# Patient Record
Sex: Female | Born: 1955 | Race: White | Hispanic: No | Marital: Married | Smoking: Never smoker
Health system: Southern US, Community
[De-identification: ages and names within clinical notes are randomized; demographics above are authoritative.]

## PROBLEM LIST (undated history)

## (undated) DIAGNOSIS — E039 Hypothyroidism, unspecified: Secondary | ICD-10-CM

## (undated) DIAGNOSIS — M25559 Pain in unspecified hip: Secondary | ICD-10-CM

## (undated) DIAGNOSIS — H269 Unspecified cataract: Secondary | ICD-10-CM

## (undated) DIAGNOSIS — M1990 Unspecified osteoarthritis, unspecified site: Secondary | ICD-10-CM

## (undated) DIAGNOSIS — I10 Essential (primary) hypertension: Secondary | ICD-10-CM

## (undated) HISTORY — PX: ABDOMINAL HYSTERECTOMY: SHX81

## (undated) HISTORY — PX: CHOLECYSTECTOMY: SHX55

## (undated) HISTORY — PX: BACK SURGERY: SHX140

---

## 2004-08-03 ENCOUNTER — Encounter: Admission: RE | Admit: 2004-08-03 | Discharge: 2004-08-03 | Payer: Self-pay | Admitting: Internal Medicine

## 2004-08-27 ENCOUNTER — Encounter: Admission: RE | Admit: 2004-08-27 | Discharge: 2004-08-27 | Payer: Self-pay | Admitting: Specialist

## 2004-09-24 ENCOUNTER — Encounter: Admission: RE | Admit: 2004-09-24 | Discharge: 2004-09-24 | Payer: Self-pay | Admitting: Specialist

## 2004-11-14 ENCOUNTER — Encounter: Admission: RE | Admit: 2004-11-14 | Discharge: 2004-11-14 | Payer: Self-pay | Admitting: Neurology

## 2004-11-24 ENCOUNTER — Encounter: Admission: RE | Admit: 2004-11-24 | Discharge: 2004-11-24 | Payer: Self-pay | Admitting: Neurology

## 2004-12-27 ENCOUNTER — Ambulatory Visit (HOSPITAL_COMMUNITY): Admission: RE | Admit: 2004-12-27 | Discharge: 2004-12-28 | Payer: Self-pay | Admitting: Specialist

## 2005-11-11 IMAGING — CT CT L SPINE W/ CM
3 of 11 series · 13 of 36 positions shown, 15 images · IV contrast (omnipaque)
Comparison: none

CLINICAL DATA: Back and right leg pain. 
 LUMBAR MYELOGRAM:
 Following informed consent, sterile preparation of the back, and adequate local anesthesia, a lumbar puncture was performed using a 22 gauge spinal needle at L2-3 from a left paramedian approach.  Fluid was clear and colorless.  15 cc of Omnipaque 180 was instilled in the subarachnoid space.  AP, lateral, and oblique views demonstrate a mild bulge at L4-5.  There is no nerve root cut off or spinal stenosis.  Flexion/extension show no abnormal movement.  Side-to-side bending shows no increase in lateral recess stenosis.  
 POST MYELOGRAM CT:
 L1-2:  Normal interspace. 
 L2-3:  Normal interspace. 
 L3-4:  Normal interspace. 
 L4-5:  Mild bulge.  Mild facet disease.  No stenosis or disk protrusion.
 L5-S1:  Mild bulge.  No stenosis or disk protrusion.
 Far right and left parasagittal images show no significant foraminal narrowing.

[Series 4: recon 3: l-spine helical · axial · 0.27mm/px · z∈[-267,-113]mm · 5 of 377 slices shown, 7 images]
[im 63/377  soft-tissue]
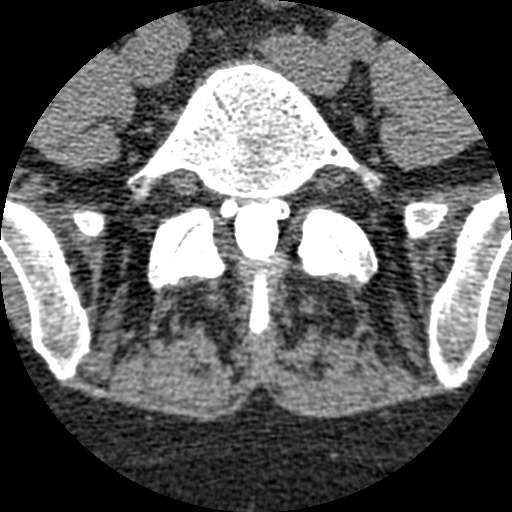
[im 63/377  bone]
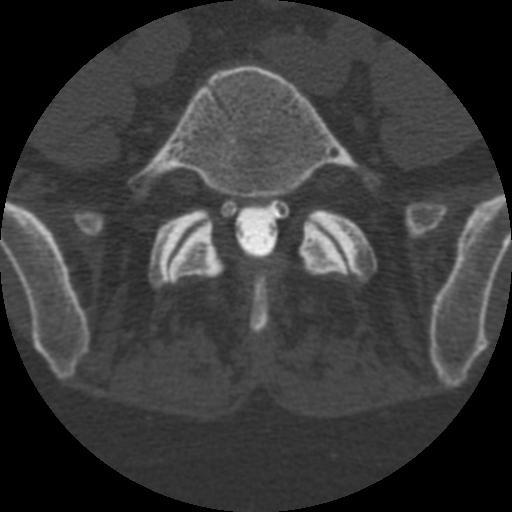
[im 126/377  bone]
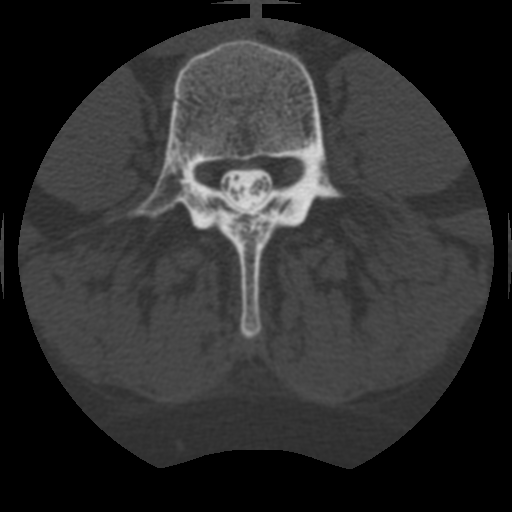
[im 189/377  bone]
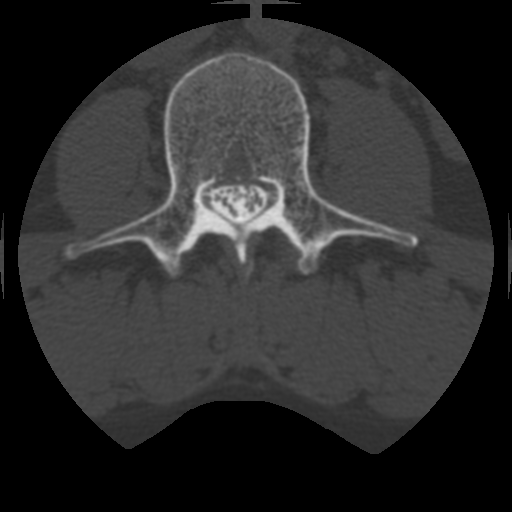
[im 251/377  bone]
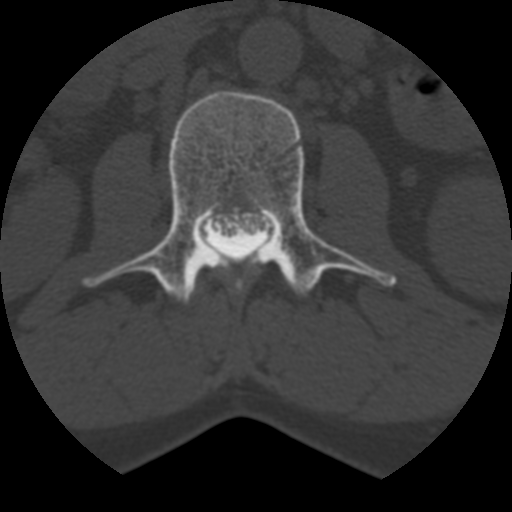
[im 314/377  soft-tissue]
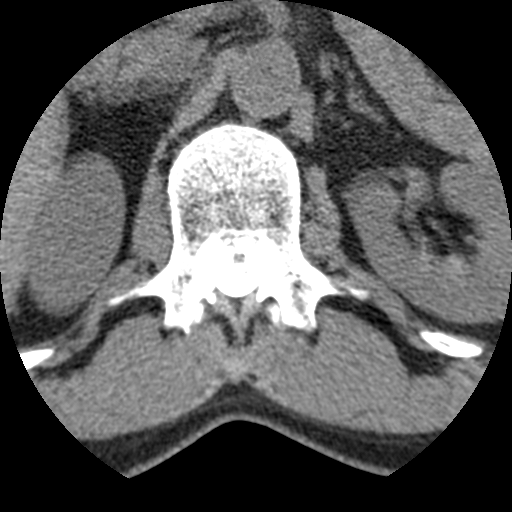
[im 314/377  bone]
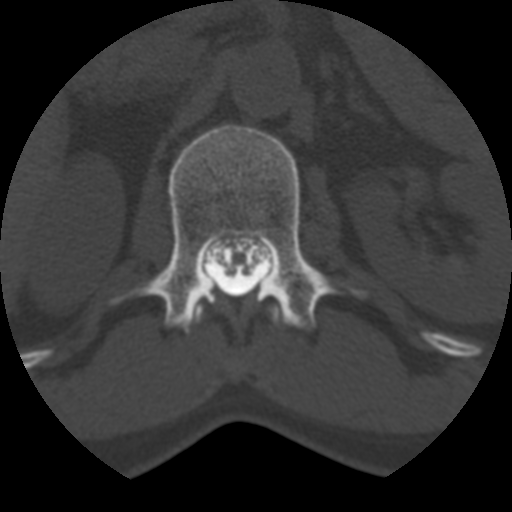

[Series 400: reformatted · sagittal · 0.46mm/px · 2 of 40 slices shown (1 of 2)]
[im 14/40  bone]
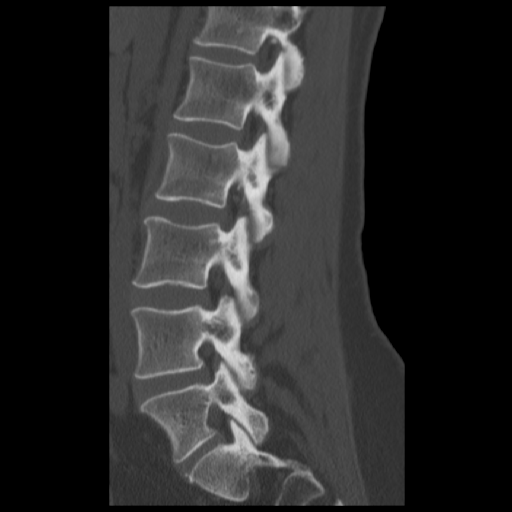
[im 27/40  bone]
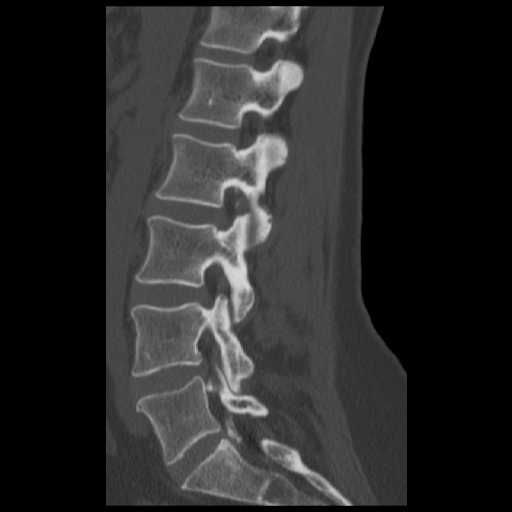

[Series 401: reformatted · coronal · 0.46mm/px · 6 of 20 slices shown (2 of 2)]
[im 7/20  bone]
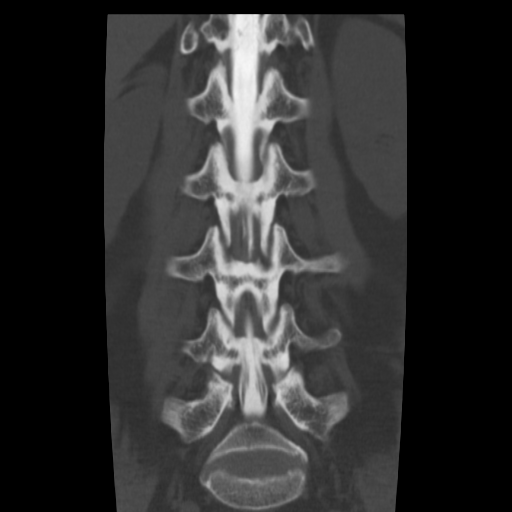
[im 8/20  bone]
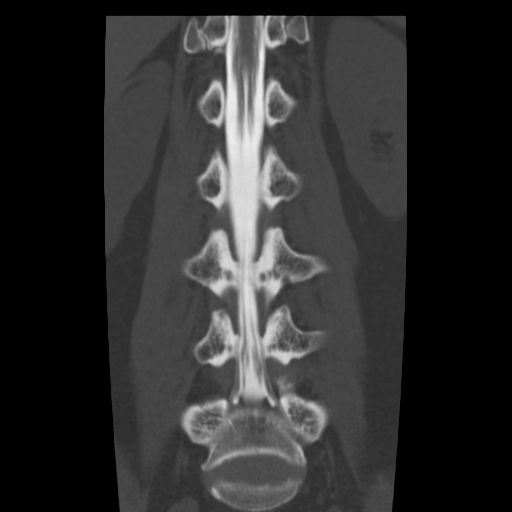
[im 10/20  bone]
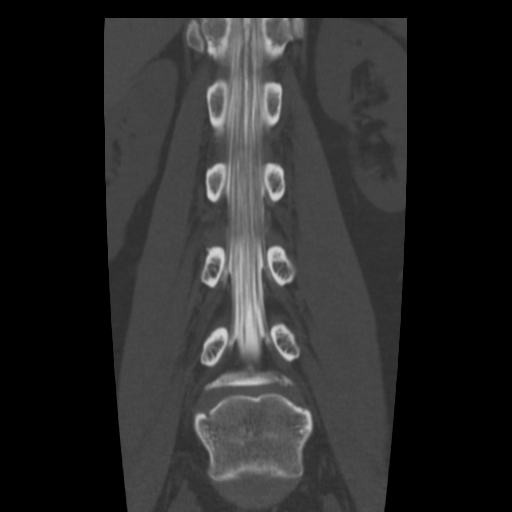
[im 11/20  soft-tissue]
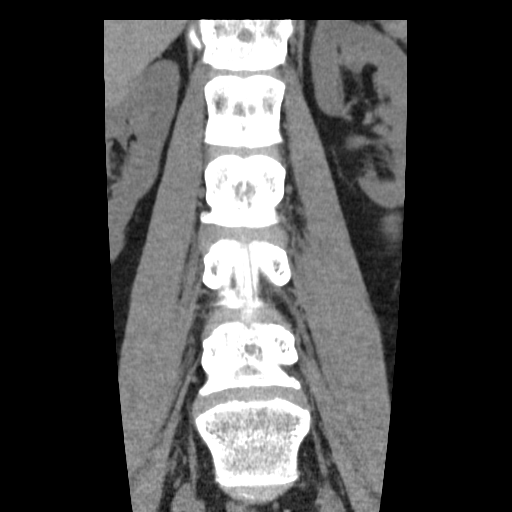
[im 12/20  bone]
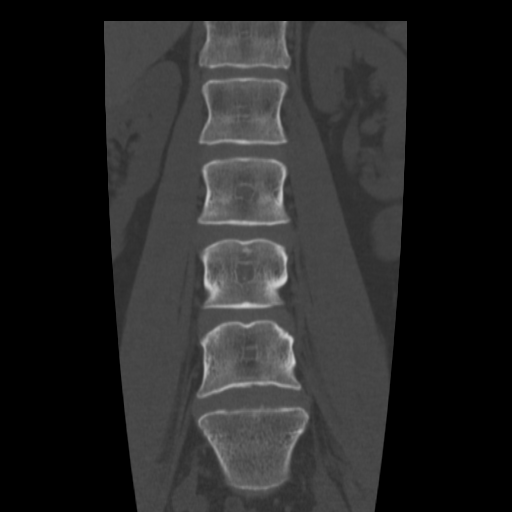
[im 13/20  bone]
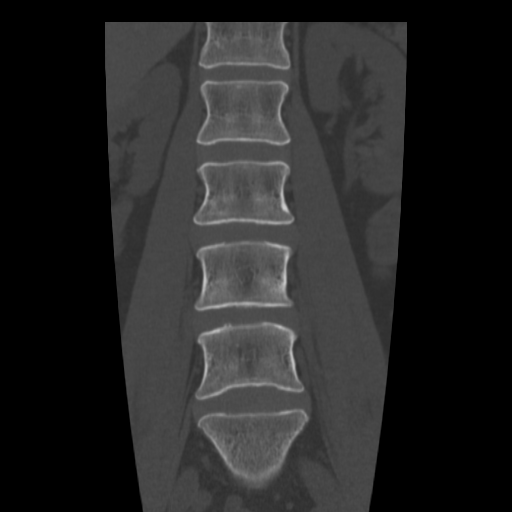

[13 of 36 positions shown; findings below may reference images not displayed]

IMPRESSION: 1.  Mild annular bulging L4-5 and L5-S1.
 2.  No significant disk protrusion or spinal stenosis.
 3.  Mild facet arthropathy L4-5.

## 2006-02-13 IMAGING — CR DG SPINE 1V PORT
1 series · 1 of 1 positions shown · non-contrast
Comparison: none.

CLINICAL DATA: L4-5 lumbar stenosis.
 LUMBAR SPINE ? 1   VIEW:

[view not recorded]
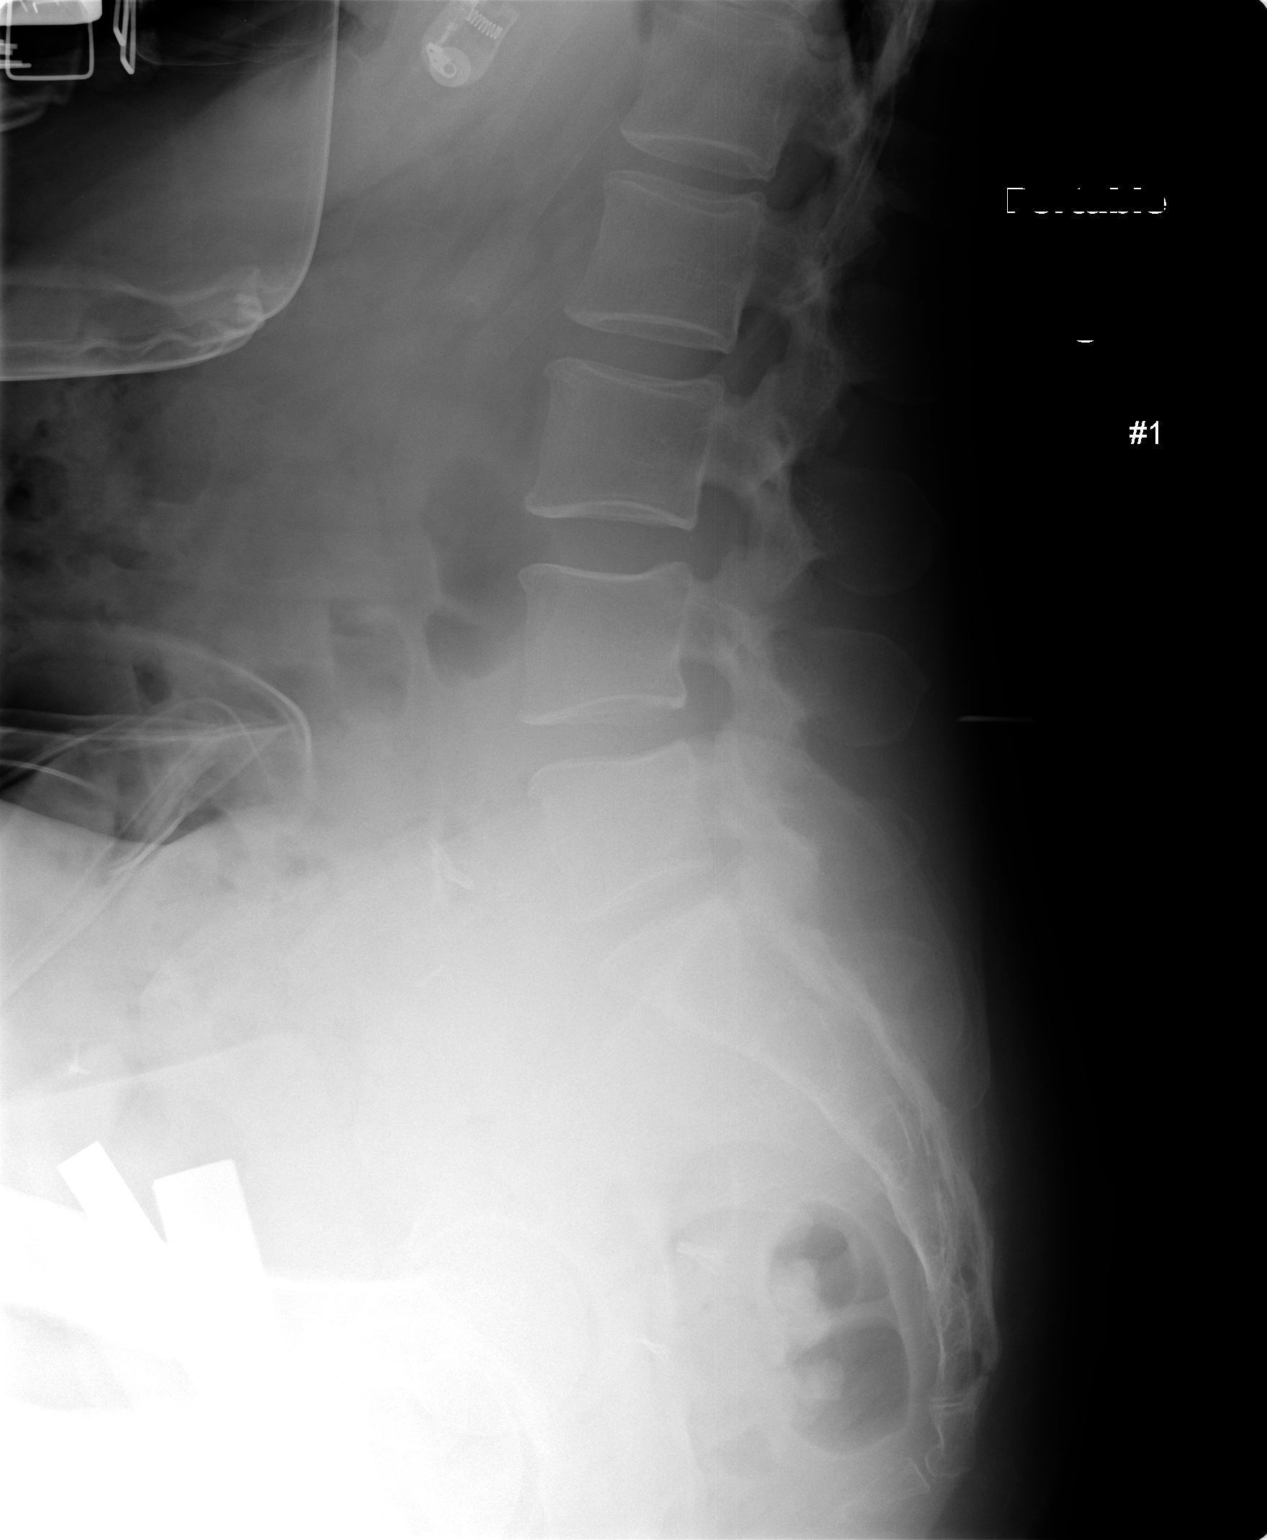

[1 of 1 positions shown; findings below may reference images not displayed]

FINDINGS: Alignment of the lumbar spine is normal.  Vertebral body heights and disk spaces are well preserved.  Surgical probe is identified posterior to the L4-5 disk space.
IMPRESSION: Probe posterior to the L4-5 disk space.

## 2006-02-13 IMAGING — CR DG LUMBAR SPINE 1V
1 series · 1 of 1 positions shown · non-contrast
Comparison: 12/27/04.

CLINICAL DATA: HNP at L4-5. 
 LUMBAR SPINE - 1 VIEW:

[view not recorded]
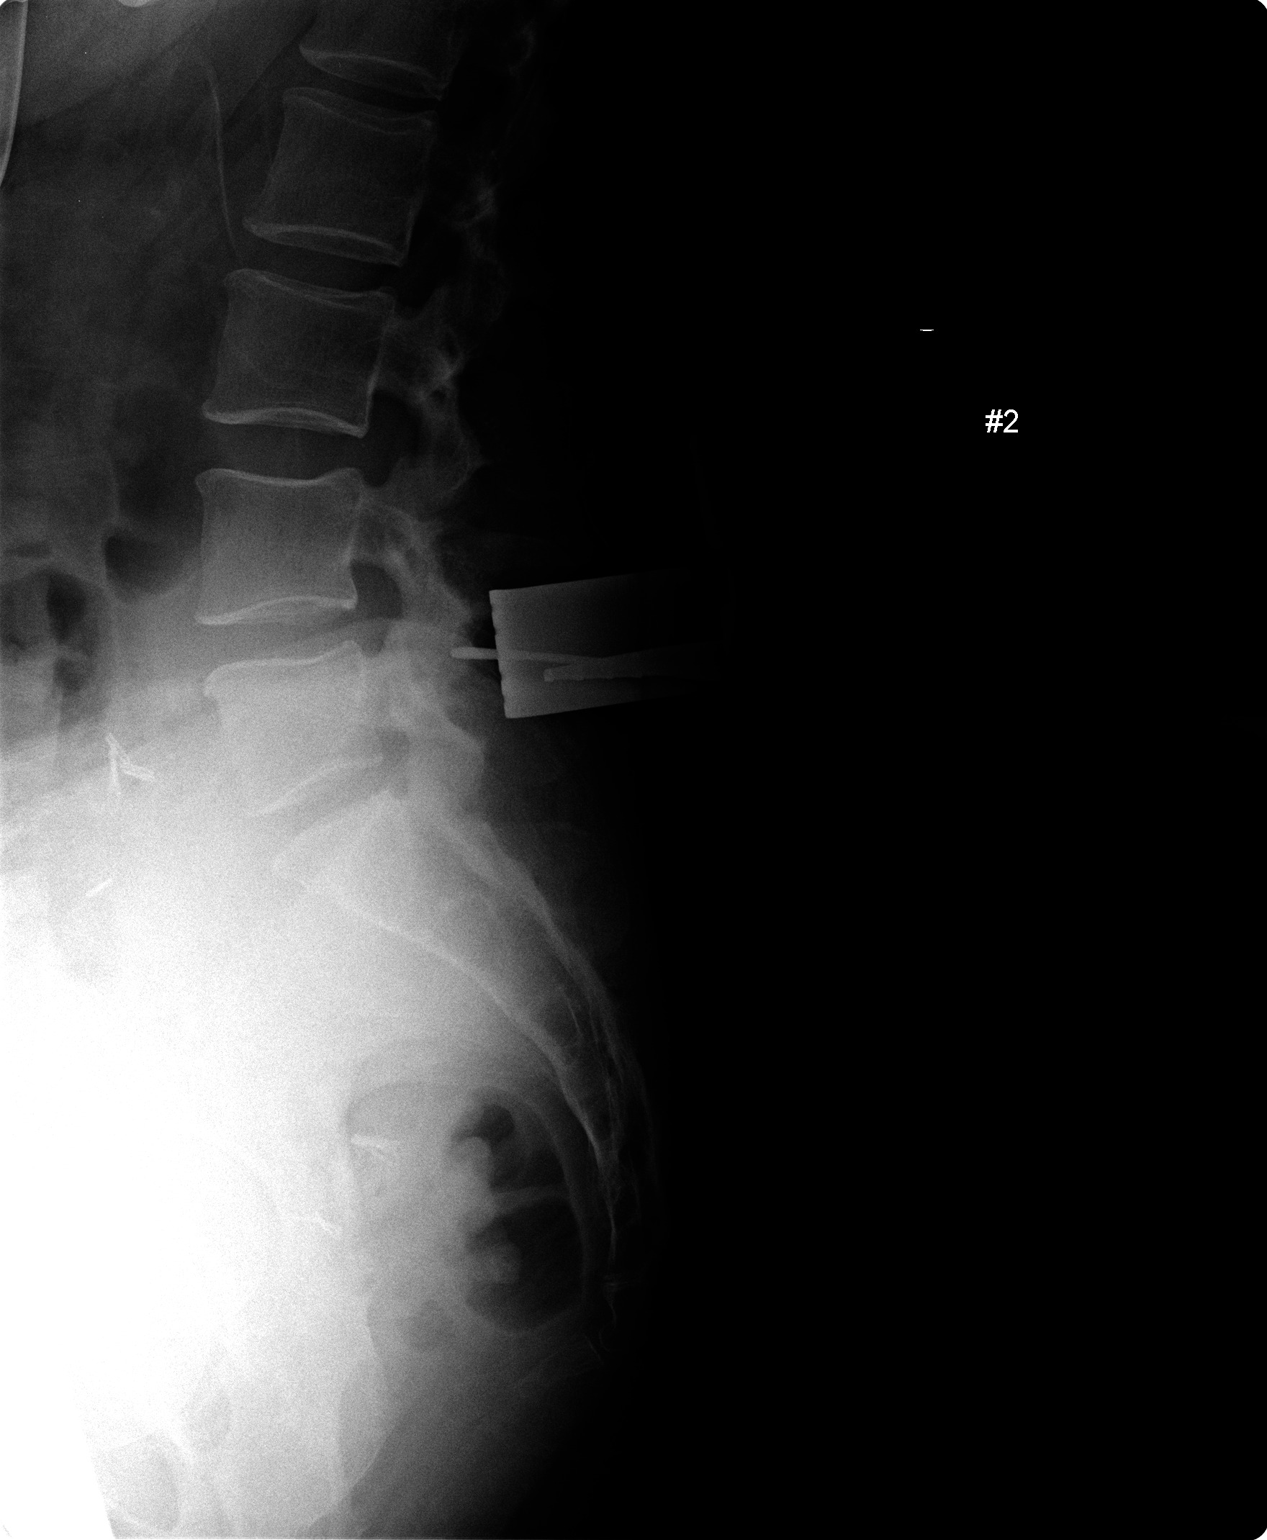

[1 of 1 positions shown; findings below may reference images not displayed]

FINDINGS: Alignment of the lumbar spine is normal.  Surgical probe is identified posterior to the L4-5 disc space.
IMPRESSION: Probe posterior to L4-5 disc space.

## 2006-02-25 HISTORY — PX: HIP ARTHROPLASTY: SHX981

## 2006-03-07 ENCOUNTER — Ambulatory Visit (HOSPITAL_COMMUNITY): Admission: RE | Admit: 2006-03-07 | Discharge: 2006-03-07 | Payer: Self-pay | Admitting: Orthopedic Surgery

## 2011-06-23 ENCOUNTER — Other Ambulatory Visit: Payer: Self-pay | Admitting: Orthopedic Surgery

## 2011-06-23 MED ORDER — DEXAMETHASONE SODIUM PHOSPHATE 10 MG/ML IJ SOLN: 10.0000 mg | Freq: Once | INTRAMUSCULAR | Status: DC

## 2011-07-26 ENCOUNTER — Encounter (HOSPITAL_COMMUNITY): Payer: Self-pay | Admitting: Pharmacy Technician

## 2011-07-30 ENCOUNTER — Ambulatory Visit (HOSPITAL_COMMUNITY)
Admission: RE | Admit: 2011-07-30 | Discharge: 2011-07-30 | Disposition: A | Discharge status: Home / Self Care | Payer: BC Managed Care – PPO | Source: Ambulatory Visit | Attending: Orthopedic Surgery | Admitting: Orthopedic Surgery

## 2011-07-30 ENCOUNTER — Encounter (HOSPITAL_COMMUNITY): Payer: Self-pay

## 2011-07-30 ENCOUNTER — Encounter (HOSPITAL_COMMUNITY)
Admission: RE | Admit: 2011-07-30 | Discharge: 2011-07-30 | Disposition: A | Discharge status: Home / Self Care | Payer: BC Managed Care – PPO | Source: Ambulatory Visit | Attending: Orthopedic Surgery | Admitting: Orthopedic Surgery

## 2011-07-30 DIAGNOSIS — Z01818 Encounter for other preprocedural examination: Secondary | ICD-10-CM | POA: Insufficient documentation

## 2011-07-30 DIAGNOSIS — Z01810 Encounter for preprocedural cardiovascular examination: Secondary | ICD-10-CM | POA: Insufficient documentation

## 2011-07-30 DIAGNOSIS — Z01812 Encounter for preprocedural laboratory examination: Secondary | ICD-10-CM | POA: Insufficient documentation

## 2011-07-30 HISTORY — DX: Pain in unspecified hip: M25.559

## 2011-07-30 HISTORY — DX: Hypothyroidism, unspecified: E03.9

## 2011-07-30 HISTORY — DX: Essential (primary) hypertension: I10

## 2011-07-30 LAB — CBC
Hemoglobin: 14.9 g/dL (ref 12.0–15.0)
Platelets: 245 10*3/uL (ref 150–400)
RBC: 4.71 MIL/uL (ref 3.87–5.11)
WBC: 7.7 10*3/uL (ref 4.0–10.5)

## 2011-07-30 LAB — BASIC METABOLIC PANEL
CO2: 29 mEq/L (ref 19–32)
Calcium: 9.7 mg/dL (ref 8.4–10.5)
Chloride: 100 mEq/L (ref 96–112)
Glucose, Bld: 104 mg/dL — ABNORMAL HIGH (ref 70–99)
Potassium: 3.5 mEq/L (ref 3.5–5.1)
Sodium: 138 mEq/L (ref 135–145)

## 2011-07-30 LAB — SURGICAL PCR SCREEN
MRSA, PCR: NEGATIVE
Staphylococcus aureus: NEGATIVE

## 2011-07-30 MED ORDER — CHLORHEXIDINE GLUCONATE 4 % EX LIQD
60.0000 mL | Freq: Once | CUTANEOUS | Status: DC
  Filled 2011-07-30: qty 60

## 2011-07-30 NOTE — Patient Instructions (Signed)
20 Stacey Hernandez  07/30/2011   Your procedure is scheduled on:  08/02/11  Report to SHORT STAY DEPT  at 12:45 PM.  Call this number if you have problems the morning of surgery: [phone]   Remember:   Do not eat food or drink liquids AFTER MIDNIGHT  May have clear liquids UNTIL 6 HOURS BEFORE SURGERY (9:00 AM )  Clear liquids include soda, tea, black coffee, apple or grape juice, broth.  Take these medicines the morning of surgery with A SIP OF WATER: HYDROCODONE IF NEEDED / SYNTHROID   Do not wear jewelry, make-up or nail polish.  Do not wear lotions, powders, or perfumes.   Do not shave legs or underarms 48 hrs. before surgery (men may shave face)  Do not bring valuables to the hospital.  Contacts, dentures or bridgework may not be worn into surgery.  Leave suitcase in the car. After surgery it may be brought to your room.  For patients admitted to the hospital, checkout time is 11:00 AM the day of discharge.   Patients discharged the day of surgery will not be allowed to drive home.    Special Instructions:   Please read over the following fact sheets that you were given: MRSA  Information / Incentive Spirometer               SHOWER WITH BETASEPT THE NIGHT BEFORE SURGERY AND THE MORNING OF SURGERY

## 2011-08-02 ENCOUNTER — Encounter (HOSPITAL_COMMUNITY): Payer: Self-pay | Admitting: Anesthesiology

## 2011-08-02 ENCOUNTER — Encounter (HOSPITAL_COMMUNITY): Payer: Self-pay | Admitting: *Deleted

## 2011-08-02 ENCOUNTER — Encounter (HOSPITAL_COMMUNITY)
Admission: RE | Disposition: A | Discharge status: Home / Self Care | Payer: Self-pay | Source: Ambulatory Visit | Attending: Orthopedic Surgery

## 2011-08-02 ENCOUNTER — Ambulatory Visit (HOSPITAL_COMMUNITY): Payer: BC Managed Care – PPO

## 2011-08-02 ENCOUNTER — Ambulatory Visit (HOSPITAL_COMMUNITY): Payer: BC Managed Care – PPO | Admitting: Anesthesiology

## 2011-08-02 ENCOUNTER — Ambulatory Visit (HOSPITAL_COMMUNITY)
Admission: RE | Admit: 2011-08-02 | Discharge: 2011-08-02 | Disposition: A | Discharge status: Home / Self Care | Payer: BC Managed Care – PPO | Source: Ambulatory Visit | Attending: Orthopedic Surgery | Admitting: Orthopedic Surgery

## 2011-08-02 DIAGNOSIS — E039 Hypothyroidism, unspecified: Secondary | ICD-10-CM | POA: Insufficient documentation

## 2011-08-02 DIAGNOSIS — I10 Essential (primary) hypertension: Secondary | ICD-10-CM | POA: Insufficient documentation

## 2011-08-02 DIAGNOSIS — M25559 Pain in unspecified hip: Secondary | ICD-10-CM

## 2011-08-02 DIAGNOSIS — M24159 Other articular cartilage disorders, unspecified hip: Secondary | ICD-10-CM | POA: Insufficient documentation

## 2011-08-02 DIAGNOSIS — Z79899 Other long term (current) drug therapy: Secondary | ICD-10-CM | POA: Insufficient documentation

## 2011-08-02 HISTORY — PX: HIP ARTHROSCOPY: SHX668

## 2011-08-02 SURGERY — ARTHROSCOPY HIP
Anesthesia: General | Site: Hip | Laterality: Right | Wound class: Clean

## 2011-08-02 MED ORDER — LACTATED RINGERS IV SOLN
INTRAVENOUS | Status: DC
  Administered 2011-08-02: 17:00:00 via INTRAVENOUS

## 2011-08-02 MED ORDER — LACTATED RINGERS IR SOLN
Status: DC | PRN
  Administered 2011-08-02: 3000 mL

## 2011-08-02 MED ORDER — PROPOFOL 10 MG/ML IV EMUL
INTRAVENOUS | Status: DC | PRN
  Administered 2011-08-02: 150 mg via INTRAVENOUS

## 2011-08-02 MED ORDER — MIDAZOLAM HCL 5 MG/5ML IJ SOLN
INTRAMUSCULAR | Status: DC | PRN
  Administered 2011-08-02: 2 mg via INTRAVENOUS

## 2011-08-02 MED ORDER — BUPIVACAINE-EPINEPHRINE PF 0.25-1:200000 % IJ SOLN
INTRAMUSCULAR | Status: AC
  Filled 2011-08-02: qty 30

## 2011-08-02 MED ORDER — HYDROCODONE-ACETAMINOPHEN 10-325 MG PO TABS
ORAL_TABLET | ORAL | Status: AC
  Filled 2011-08-02: qty 1

## 2011-08-02 MED ORDER — HYDROCODONE-ACETAMINOPHEN 10-325 MG PO TABS: 1.0000 | ORAL_TABLET | ORAL | Status: AC | PRN

## 2011-08-02 MED ORDER — HYDROCODONE-ACETAMINOPHEN 10-325 MG PO TABS
1.0000 | ORAL_TABLET | Freq: Once | ORAL | Status: AC
  Administered 2011-08-02: 1 via ORAL

## 2011-08-02 MED ORDER — METHOCARBAMOL 100 MG/ML IJ SOLN
500.0000 mg | Freq: Once | INTRAVENOUS | Status: AC
  Administered 2011-08-02: 500 mg via INTRAVENOUS
  Filled 2011-08-02: qty 5

## 2011-08-02 MED ORDER — METHOCARBAMOL 500 MG PO TABS: ORAL_TABLET | ORAL | Status: DC

## 2011-08-02 MED ORDER — SODIUM CHLORIDE 0.9 % IJ SOLN
INTRAMUSCULAR | Status: DC | PRN
  Administered 2011-08-02: 30 mL

## 2011-08-02 MED ORDER — LACTATED RINGERS IV SOLN
INTRAVENOUS | Status: DC
  Administered 2011-08-02: 1000 mL via INTRAVENOUS

## 2011-08-02 MED ORDER — MEPERIDINE HCL 50 MG/ML IJ SOLN: 6.2500 mg | INTRAMUSCULAR | Status: DC | PRN

## 2011-08-02 MED ORDER — SODIUM CHLORIDE 0.9 % IV SOLN: INTRAVENOUS | Status: DC

## 2011-08-02 MED ORDER — ACETAMINOPHEN 10 MG/ML IV SOLN
1000.0000 mg | Freq: Once | INTRAVENOUS | Status: AC
  Administered 2011-08-02: 1000 mg via INTRAVENOUS
  Filled 2011-08-02: qty 100

## 2011-08-02 MED ORDER — PROMETHAZINE HCL 25 MG/ML IJ SOLN: 6.2500 mg | INTRAMUSCULAR | Status: DC | PRN

## 2011-08-02 MED ORDER — BUPIVACAINE-EPINEPHRINE 0.25% -1:200000 IJ SOLN
INTRAMUSCULAR | Status: DC | PRN
  Administered 2011-08-02: 20 mL

## 2011-08-02 MED ORDER — ONDANSETRON HCL 4 MG/2ML IJ SOLN
INTRAMUSCULAR | Status: DC | PRN
  Administered 2011-08-02: 4 mg via INTRAVENOUS

## 2011-08-02 MED ORDER — LIDOCAINE HCL (CARDIAC) 20 MG/ML IV SOLN
INTRAVENOUS | Status: DC | PRN
  Administered 2011-08-02: 100 mg via INTRAVENOUS

## 2011-08-02 MED ORDER — FENTANYL CITRATE 0.05 MG/ML IJ SOLN
INTRAMUSCULAR | Status: DC | PRN
  Administered 2011-08-02: 1 ug via INTRAVENOUS
  Administered 2011-08-02: 50 ug via INTRAVENOUS

## 2011-08-02 MED ORDER — CEFAZOLIN SODIUM-DEXTROSE 2-3 GM-% IV SOLR
2.0000 g | INTRAVENOUS | Status: AC
  Administered 2011-08-02: 2 g via INTRAVENOUS

## 2011-08-02 MED ORDER — ACETAMINOPHEN 10 MG/ML IV SOLN
INTRAVENOUS | Status: AC
  Filled 2011-08-02: qty 100

## 2011-08-02 MED ORDER — EPHEDRINE SULFATE 50 MG/ML IJ SOLN
INTRAMUSCULAR | Status: DC | PRN
  Administered 2011-08-02 (×4): 10 mg via INTRAVENOUS

## 2011-08-02 MED ORDER — DEXAMETHASONE SODIUM PHOSPHATE 4 MG/ML IJ SOLN
INTRAMUSCULAR | Status: DC | PRN
  Administered 2011-08-02: 10 mg via INTRAVENOUS

## 2011-08-02 MED ORDER — HYDROMORPHONE HCL PF 1 MG/ML IJ SOLN
INTRAMUSCULAR | Status: AC
  Filled 2011-08-02: qty 1

## 2011-08-02 MED ORDER — HYDROMORPHONE HCL PF 1 MG/ML IJ SOLN
0.2500 mg | INTRAMUSCULAR | Status: DC | PRN
  Administered 2011-08-02 (×2): 0.5 mg via INTRAVENOUS

## 2011-08-02 MED ORDER — CEFAZOLIN SODIUM-DEXTROSE 2-3 GM-% IV SOLR
INTRAVENOUS | Status: AC
  Filled 2011-08-02: qty 50

## 2011-08-02 MED ORDER — HETASTARCH-ELECTROLYTES 6 % IV SOLN
INTRAVENOUS | Status: DC | PRN
  Administered 2011-08-02: 16:00:00 via INTRAVENOUS

## 2011-08-02 SURGICAL SUPPLY — 34 items
BLADE DA 4.2 (BLADE) ×2 IMPLANT
CLOTH BEACON ORANGE TIMEOUT ST (SAFETY) ×2 IMPLANT
COVER MAYO STAND STRL (DRAPES) ×2 IMPLANT
DECANTER SPIKE VIAL GLASS SM (MISCELLANEOUS) ×2 IMPLANT
DRAPE LG THREE QUARTER DISP (DRAPES) ×3 IMPLANT
DRAPE STERI 35X30 U-POUCH (DRAPES) ×4 IMPLANT
DRAPE STERI IOBAN 125X83 (DRAPES) ×2 IMPLANT
DRSG EMULSION OIL 3X3 NADH (GAUZE/BANDAGES/DRESSINGS) ×1 IMPLANT
DRSG MEPILEX BORDER 4X8 (GAUZE/BANDAGES/DRESSINGS) ×2 IMPLANT
DRSG PAD ABDOMINAL 8X10 ST (GAUZE/BANDAGES/DRESSINGS) ×1 IMPLANT
DURAPREP 26ML APPLICATOR (WOUND CARE) ×2 IMPLANT
GLOVE BIO SURGEON STRL SZ7.5 (GLOVE) ×2 IMPLANT
GLOVE BIO SURGEON STRL SZ8 (GLOVE) ×2 IMPLANT
GLOVE BIOGEL PI IND STRL 8 (GLOVE) ×2 IMPLANT
GLOVE BIOGEL PI INDICATOR 8 (GLOVE) ×2
GOWN STRL NON-REIN LRG LVL3 (GOWN DISPOSABLE) ×2 IMPLANT
GOWN STRL REIN XL XLG (GOWN DISPOSABLE) ×2 IMPLANT
IV LACTATED RINGER IRRG 3000ML (IV SOLUTION) ×4
IV LR IRRIG 3000ML ARTHROMATIC (IV SOLUTION) IMPLANT
KIT HIP ARTHROSCOPY (SET/KITS/TRAYS/PACK) ×2 IMPLANT
MANIFOLD NEPTUNE II (INSTRUMENTS) ×3 IMPLANT
NEEDLE HYPO 22GX1.5 SAFETY (NEEDLE) ×1 IMPLANT
NS IRRIG 1000ML POUR BTL (IV SOLUTION) ×1 IMPLANT
PACK ARTHROSCOPY WL (CUSTOM PROCEDURE TRAY) ×2 IMPLANT
PADDING CAST COTTON 6X4 STRL (CAST SUPPLIES) ×1 IMPLANT
POSITIONER SURGICAL ARM (MISCELLANEOUS) ×2 IMPLANT
SET ARTHROSCOPY TUBING (MISCELLANEOUS) ×2
SET ARTHROSCOPY TUBING LN (MISCELLANEOUS) ×1 IMPLANT
SHAVER EXTENDED LENGTH 4.2 (BLADE) IMPLANT
SPONGE GAUZE 4X4 12PLY (GAUZE/BANDAGES/DRESSINGS) ×1 IMPLANT
SUT ETHILON 4 0 PS 2 18 (SUTURE) ×2 IMPLANT
TOWEL OR 17X26 10 PK STRL BLUE (TOWEL DISPOSABLE) ×3 IMPLANT
TOWEL OR NON WOVEN STRL DISP B (DISPOSABLE) ×1 IMPLANT
WAND TURBOVAC 50 DEGREE (SURGICAL WAND) ×1 IMPLANT

## 2011-08-02 NOTE — Anesthesia Postprocedure Evaluation (Signed)
  Anesthesia Post-op Note  Patient: Stacey Hernandez  Procedure(s) Performed: Procedure(s) (LRB): ARTHROSCOPY HIP (Right)  Patient Location: PACU  Anesthesia Type: General  Level of Consciousness: awake and alert   Airway and Oxygen Therapy: Patient Spontanous Breathing  Post-op Pain: mild  Post-op Assessment: Post-op Vital signs reviewed, Patient's Cardiovascular Status Stable, Respiratory Function Stable, Patent Airway and No signs of Nausea or vomiting  Post-op Vital Signs: stable  Complications: No apparent anesthesia complications

## 2011-08-02 NOTE — Interval H&P Note (Signed)
History and Physical Interval Note:  08/02/2011 3:07 PM  Stacey Hernandez  has presented today for surgery, with the diagnosis of right hip labral tear  The various methods of treatment have been discussed with the patient and family. After consideration of risks, benefits and other options for treatment, the patient has consented to  Procedure(s) (LRB): ARTHROSCOPY HIP (Right) as a surgical intervention .  The patients' history has been reviewed, patient examined, no change in status, stable for surgery.  I have reviewed the patients' chart and labs.  Questions were answered to the patient's satisfaction.     Loanne Drilling

## 2011-08-02 NOTE — Transfer of Care (Signed)
Immediate Anesthesia Transfer of Care Note  Patient: Stacey Hernandez  Procedure(s) Performed: Procedure(s) (LRB): ARTHROSCOPY HIP (Right)  Patient Location: PACU  Anesthesia Type: General  Level of Consciousness: awake, alert  and oriented  Airway & Oxygen Therapy: Patient Spontanous Breathing and Patient connected to face mask oxygen  Post-op Assessment: Report given to PACU RN and Post -op Vital signs reviewed and stable  Post vital signs: Reviewed and stable  Complications: No apparent anesthesia complications

## 2011-08-02 NOTE — Preoperative (Signed)
Beta Blockers   Reason not to administer Beta Blockers:Not Applicable 

## 2011-08-02 NOTE — Anesthesia Preprocedure Evaluation (Signed)
Anesthesia Evaluation  Patient identified by MRN, date of birth, ID band Patient awake    Reviewed: Allergy & Precautions, H&P , NPO status , Patient's Chart, lab work & pertinent test results  Airway Mallampati: I TM Distance: >3 FB Neck ROM: Full    Dental No notable dental hx.    Pulmonary neg pulmonary ROS,  breath sounds clear to auscultation  Pulmonary exam normal       Cardiovascular hypertension, Pt. on medications negative cardio ROS  Rhythm:Regular Rate:Normal     Neuro/Psych negative neurological ROS  negative psych ROS   GI/Hepatic negative GI ROS, Neg liver ROS,   Endo/Other  negative endocrine ROSHypothyroidism   Renal/GU negative Renal ROS  negative genitourinary   Musculoskeletal negative musculoskeletal ROS (+)   Abdominal   Peds negative pediatric ROS (+)  Hematology negative hematology ROS (+)   Anesthesia Other Findings   Reproductive/Obstetrics negative OB ROS                           Anesthesia Physical Anesthesia Plan  ASA: II  Anesthesia Plan: General   Post-op Pain Management:    Induction: Intravenous  Airway Management Planned: LMA and Oral ETT  Additional Equipment:   Intra-op Plan:   Post-operative Plan: Extubation in OR  Informed Consent: I have reviewed the patients History and Physical, chart, labs and discussed the procedure including the risks, benefits and alternatives for the proposed anesthesia with the patient or authorized representative who has indicated his/her understanding and acceptance.   Dental advisory given  Plan Discussed with: CRNA  Anesthesia Plan Comments:         Anesthesia Quick Evaluation

## 2011-08-02 NOTE — Op Note (Signed)
PREOPERATIVE DIAGNOSIS:   Right hip labral tear.   POSTOPERATIVE DIAGNOSIS:  1. Right hip labral tear.  2. Chondral defect.   PROCEDURE: Right hip arthroscopy with labral debridement and chondroplasty  SURGEON: Ollen Gross, M.D.   ASSISTANT: Alexzandrew L. Perkins, P.A.C.   ANESTHESIA: General.   ESTIMATED BLOOD LOSS: Minimal.   DRAINS: None.   COMPLICATION: None.   CONDITION: Stable to recovery.   BRIEF CLINICAL INDICATION: Stacey Hernandez is a 56 y.o. female with a long  history of significant pain and dysfunction of her rightt hip. Exam and  history suggested labral tear, recurrent in nature. She  presents now for arthroscopy and  debridement.   PROCEDURE IN DETAIL: After successful administration of general  anesthetic, the patient was placed in the Left lateral decubitus  position with the Right  side up. Lower leg was well padded. Right  lower extremity was then placed over the well-padded peroneal post laterally  and then placed in the well-padded foot holder. Under fluoroscopic  guidance, traction was applied across Right lower extremity until the hip  was adequately distracted. Traction was locked in this position. The  thigh was then prepped and draped in usual sterile fashion. Spinal  needles were passed through the sites marked, anterior and posterior  peritrochanteric portals. A small incision was made around the  posterior needle. Through a cannulated system, the cannula was passed  into the joint. Camera passed into the joint. Once confirmed to be  intra-articular, inflow was initiated.      The femoral head shows a mild chondromalacia, no full-thickness defects. 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 There was a tear in the labrum going from the 2 o'clock  to the 5 o'clock position. It was not detached. Tear was on the  free  edge of the labrum. The anterior port was then accessed and labral  tear debrided back to a stable base with the shaver and the ArthroCare  device. The unstable cartilage on the surface of the acetabulum was  debrided back to a stable bony base with stable cartilaginous edges.  The exposed bone was abraded with the shaver for an abrasion  chondroplasty. The overall size of this was about 2 x 2 cm. We then  inspected rest of the joint. There was synovitis posteriorly but no tears or chondral defects,      The working portal equipment was removed and then 30 cc of 0.25% Marcaine  with epinephrine injected through the inflow cannula and that was  removed. Traction was removed from the joint and the incision was  closed with interrupted 4-0 nylon. Incisions were cleaned and dried.  Bulky sterile dressing applied. She was then awakened and transported  to recovery in stable condition.   Ollen Gross, M.D.

## 2011-08-02 NOTE — H&P (Signed)
  CC- Stacey Hernandez is a 56 y.o. female who presents with right hip pain  Hip Pain: Patient complains of right hip pain. Onset of the symptoms was several months ago. Inciting event: none. Current symptoms include is worse with weight bearing, is aggravated by walking and is worse with rotating hip. Associated symptoms: none. Aggravating symptoms: going up and down stairs, pivoting, rising after sitting and squatting. Patient's overall course: gradually worsening. Patient has had prior hip problems.Evaluation to date: MRI with labral tear..  Had previous hip arthroscopy several years ago and developed recurrent symptoms several months ago.  Past Medical History  Diagnosis Date  . Hypertension   . Hypothyroidism   . Hip pain     RT    Past Surgical History  Procedure Date  . Abdominal hysterectomy   . Back surgery   . Cholecystectomy   . Hip arthroplasty 2008    RT    Prior to Admission medications   Medication Sig Start Date End Date Taking? Authorizing Provider  HYDROcodone-acetaminophen (NORCO) 5-325 MG per tablet Take 1 tablet by mouth every 6 (six) hours as needed. Pain   Yes Historical Provider, MD  levothyroxine (SYNTHROID, LEVOTHROID) 112 MCG tablet Take 112 mcg by mouth daily before breakfast.   Yes Historical Provider, MD  triamterene-hydrochlorothiazide (MAXZIDE-25) 37.5-25 MG per tablet Take 1 tablet by mouth daily before breakfast.   Yes Historical Provider, MD    Physical Examination: General appearance - alert, well appearing, and in no distress Mental status - alert, oriented to person, place, and time Chest - clear to auscultation, no wheezes, rales or rhonchi, symmetric air entry Heart - normal rate, regular rhythm, normal S1, S2, no murmurs, rubs, clicks or gallops Abdomen - soft, nontender, nondistended, no masses or organomegaly Neurological - alert, oriented, normal speech, no focal findings or movement disorder noted  A right hip exam was  performed. TENDERNESS: moderate ROM: full GAIT: antalgic Has pain and popping on provacative testing  ASSESSMENT:Right hip labral tear  Plan: Right hip arthroscopy and debridement. Discussed procedure, risks and potential comps with patient who elects to proceed. Goal is decreased pain and improved gait, both of which are highly likely to be achieved.  Gus Rankin Stacey Aaron, MD    08/02/2011, 3:00 PM

## 2011-08-07 ENCOUNTER — Encounter (HOSPITAL_COMMUNITY): Payer: Self-pay | Admitting: Orthopedic Surgery

## 2013-10-15 ENCOUNTER — Ambulatory Visit: Payer: Self-pay

## 2014-11-08 ENCOUNTER — Ambulatory Visit: Payer: Self-pay | Admitting: Orthopedic Surgery

## 2014-11-08 NOTE — H&P (Signed)
Stacey Hernandez DOB: 03/04/1955 Married / Language: English / Race: White Female Date of Admission:  12/07/2014 CC:  Right Hip Pain History of Present Illness The patient is a 59 year old female who comes in for a preoperative History and Physical. The patient is scheduled for a right total hip arthroplasty (anterior) to be performed by Dr. Frank V. Aluisio, MD at [facility] on 12-07-2014. The patient is a 58 year old female who presented with a hip problem. The symptoms began following a specific injury. The injury occured due to twisting while the patient was at home. Symptoms reported include hip pain, popping, difficulty flexing hip and difficulty rotating hip The patient reports symptoms radiating to the: right groin, right thigh and lower back and right flank area. The patient describes the hip problem as aching.The patient feels as if their symptoms are feels as if they are improving (pain was terrible for about three weeks, but has started to get better). Pertinent medical history includes hip bursitis (She also had arthroscopy of the hip, with labral debridement, in 2013). She has had two hip arthroscopies in the past for labral tears. She has been doing pretty well for the last year or so. She has had pain in the anterior groin of her hip since then, but it has been getting a little bit better. It has not popped again since the first time, but it has been painful. No radicular component to the pain. No increased pain with coughing or sneezing. She has had a marked increase in hip pain since February. She actually did pretty well until February, but she had an episode where she twists her hip, felt a pop, and had a marked increase in pain. It has been present since. This is really limiting which she can and cannot do. She had an intraarticular injection did not provide any significant benefit. Pain is in her groin traveling down her thigh. It is hurting her day and sometimes at  night. She has got progressive arthritis. She may have had another tear, but at this point another arthroscopy is not an option. Any other arthroscopic procedure is going to exacerbate the arthritis and it is going to lead to the ultimate procedure which is a hip replacement. She is at a stage where she rather just go ahead and proceed with a hip replacement and I feel it is definitely indicated given the degenerative change in symptoms that she is having. We did discuss hip replacement in detail and she would like to go ahead and proceed. They have been treated conservatively in the past for the above stated problem and despite conservative measures, they continue to have progressive pain and severe functional limitations and dysfunction. They have failed non-operative management including home exercise, medications, and injection. It is felt that they would benefit from undergoing total joint replacement. Risks and benefits of the procedure have been discussed with the patient and they elect to proceed with surgery. There are no active contraindications to surgery such as ongoing infection or rapidly progressive neurological disease.  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 CeleBREX *ANALGESICS - ANTI-INFLAMMATORY* Swelling.  Family History Hypertension First Degree Relatives. mother  Social History Living situation live with spouse Children 4 Pain Contract no Marital status married Drug/Alcohol Rehab (Currently) no Alcohol use never consumed alcohol Current work status working full time Tobacco use Never smoker. never smoker Drug/Alcohol Rehab (Previously)   no Tobacco / smoke exposure no Illicit drug use no Post-Surgical Plans Home  Medication History  One-A-Day Essential (Oral) Active. Vitamin D (2000UNIT Tablet, Oral)  Active. Levothyroxine Sodium (112MCG Tablet, Oral) Active. Montelukast Sodium (10MG Tablet, Oral) Active. Progesterone (Oral) Specific dose unknown - Active.  Past Surgical History  Hysterectomy Date: 2001. complete (non-cancerous) Gallbladder Surgery Date: 2005. laporoscopic Right Hip Arthroscopy [Insert Date into Details]. 2008 and 2013  Review of Systems General Not Present- Chills, Fatigue, Fever, Memory Loss, Night Sweats, Weight Gain and Weight Loss. Skin Not Present- Eczema, Hives, Itching, Lesions and Rash. HEENT Not Present- Dentures, Double Vision, Headache, Hearing Loss, Tinnitus and Visual Loss. Respiratory Not Present- Allergies, Chronic Cough, Coughing up blood, Shortness of breath at rest and Shortness of breath with exertion. Cardiovascular Not Present- Chest Pain, Difficulty Breathing Lying Down, Murmur, Palpitations, Racing/skipping heartbeats and Swelling. Gastrointestinal Not Present- Abdominal Pain, Bloody Stool, Constipation, Diarrhea, Difficulty Swallowing, Heartburn, Jaundice, Loss of appetitie, Nausea and Vomiting. Female Genitourinary Not Present- Blood in Urine, Discharge, Flank Pain, Incontinence, Painful Urination, Urgency, Urinary frequency, Urinary Retention, Urinating at Night and Weak urinary stream. Musculoskeletal Present- Joint Pain. Not Present- Back Pain, Joint Swelling, Morning Stiffness, Muscle Pain, Muscle Weakness and Spasms. Neurological Not Present- Blackout spells, Difficulty with balance, Dizziness, Paralysis, Tremor and Weakness. Psychiatric Not Present- Insomnia.  Vitals Weight: 189 lb Height: 65in Weight was reported by patient. Height was reported by patient. Body Surface Area: 1.93 m Body Mass Index: 31.45 kg/m  BP: 152/78 (Sitting, Right Arm, Standard)  Physical Exam General Mental Status -Alert, cooperative and good historian. General Appearance-pleasant, Not in acute distress. Orientation-Oriented X3. Build &  Nutrition-Well nourished and Well developed.  Head and Neck Head-normocephalic, atraumatic . Neck Global Assessment - supple, no bruit auscultated on the right, no bruit auscultated on the left.  Eye Vision-Wears corrective lenses. Pupil - Bilateral-Regular and Round. Motion - Bilateral-EOMI.  Chest and Lung Exam Auscultation Breath sounds - clear at anterior chest wall and clear at posterior chest wall. Adventitious sounds - No Adventitious sounds.  Cardiovascular Auscultation Rhythm - Regular rate and rhythm. Heart Sounds - S1 WNL and S2 WNL. Murmurs & Other Heart Sounds - Auscultation of the heart reveals - No Murmurs.  Abdomen Palpation/Percussion Tenderness - Abdomen is non-tender to palpation. Rigidity (guarding) - Abdomen is soft. Auscultation Auscultation of the abdomen reveals - Bowel sounds normal.  Female Genitourinary Note: Not done, not pertinent to present illness  Musculoskeletal Note: On exam, she is alert and oriented, no apparent distress. Evaluation of her right hip flexion 110, minimal internal rotation about 20, external rotation 30 abduction. Left hip has normal range of motion.  RADIOGRAPH Radiographs showed she has significant joint space narrowing, not fully bone- on-bone, but very close to bone-on-bone.  Assessment & Plan Primary osteoarthritis of right hip (M16.11) Note:Surgical Plans: Right Total Hip Replacement - Anterior Approach  Disposition: Home  PCP: Candice Clark NP  IV TXA  Anesthesia Issues: None  Signed electronically by Marites Nath L Xadrian Craighead, III PA-C 

## 2014-11-08 NOTE — H&P (Signed)
Stacey Hernandez DOB: 1956-01-25 Married / Language: English / Race: White Female Date of Admission:  12/07/2014 CC:  Right Hip Pain History of Present Illness The patient is a 59 year old female who comes in for a preoperative History and Physical. The patient is scheduled for a right total hip arthroplasty (anterior) to be performed by Dr. Gus Rankin. Aluisio, MD at [facility] on 12-07-2014. The patient is a 59 year old female who presented with a hip problem. The symptoms began following a specific injury. The injury occured due to twisting while the patient was at home. Symptoms reported include hip pain, popping, difficulty flexing hip and difficulty rotating hip The patient reports symptoms radiating to the: right groin, right thigh and lower back and right flank area. The patient describes the hip problem as aching.The patient feels as if their symptoms are feels as if they are improving (pain was terrible for about three weeks, but has started to get better). Pertinent medical history includes hip bursitis (She also had arthroscopy of the hip, with labral debridement, in 2013). She has had two hip arthroscopies in the past for labral tears. She has been doing pretty well for the last year or so. She has had pain in the anterior groin of her hip since then, but it has been getting a little bit better. It has not popped again since the first time, but it has been painful. No radicular component to the pain. No increased pain with coughing or sneezing. She has had a marked increase in hip pain since February. She actually did pretty well until February, but she had an episode where she twists her hip, felt a pop, and had a marked increase in pain. It has been present since. This is really limiting which she can and cannot do. She had an intraarticular injection did not provide any significant benefit. Pain is in her groin traveling down her thigh. It is hurting her day and sometimes at  night. She has got progressive arthritis. She may have had another tear, but at this point another arthroscopy is not an option. Any other arthroscopic procedure is going to exacerbate the arthritis and it is going to lead to the ultimate procedure which is a hip replacement. She is at a stage where she rather just go ahead and proceed with a hip replacement and I feel it is definitely indicated given the degenerative change in symptoms that she is having. We did discuss hip replacement in detail and she would like to go ahead and proceed. They have been treated conservatively in the past for the above stated problem and despite conservative measures, they continue to have progressive pain and severe functional limitations and dysfunction. They have failed non-operative management including home exercise, medications, and injection. It is felt that they would benefit from undergoing total joint replacement. Risks and benefits of the procedure have been discussed with the patient and they elect to proceed with surgery. There are no active contraindications to surgery such as ongoing infection or rapidly progressive neurological disease.  Problem List/Past Medical Primary osteoarthritis of right hip (M16.11) Tear of right acetabular labrum (Z61.096E) Hypothyroidism Hypertension  Allergies NSAIDs Swelling. CeleBREX *ANALGESICS - ANTI-INFLAMMATORY* Swelling.  Family History Hypertension First Degree Relatives. mother  Social History Living situation live with spouse Children 4 Pain Contract no Marital status married Drug/Alcohol Rehab (Currently) no Alcohol use never consumed alcohol Current work status working full time Tobacco use Never smoker. never smoker Drug/Alcohol Rehab (Previously)  no Tobacco / smoke exposure no Illicit drug use no Post-Surgical Plans Home  Medication History  One-A-Day Essential (Oral) Active. Vitamin D (2000UNIT Tablet, Oral)  Active. Levothyroxine Sodium ( Tablet, Oral) Active. Montelukast Sodium (10MG  Tablet, Oral) Active. Progesterone (Oral) Specific dose unknown - Active.  Past Surgical History  Hysterectomy Date: 2001. complete (non-cancerous) Gallbladder Surgery Date: 2005. laporoscopic Right Hip Arthroscopy [Insert Date into Details]. 2008 and 2013  Review of Systems General Not Present- Chills, Fatigue, Fever, Memory Loss, Night Sweats, Weight Gain and Weight Loss. Skin Not Present- Eczema, Hives, Itching, Lesions and Rash. HEENT Not Present- Dentures, Double Vision, Headache, Hearing Loss, Tinnitus and Visual Loss. Respiratory Not Present- Allergies, Chronic Cough, Coughing up blood, Shortness of breath at rest and Shortness of breath with exertion. Cardiovascular Not Present- Chest Pain, Difficulty Breathing Lying Down, Murmur, Palpitations, Racing/skipping heartbeats and Swelling. Gastrointestinal Not Present- Abdominal Pain, Bloody Stool, Constipation, Diarrhea, Difficulty Swallowing, Heartburn, Jaundice, Loss of appetitie, Nausea and Vomiting. Female Genitourinary Not Present- Blood in Urine, Discharge, Flank Pain, Incontinence, Painful Urination, Urgency, Urinary frequency, Urinary Retention, Urinating at Night and Weak urinary stream. Musculoskeletal Present- Joint Pain. Not Present- Back Pain, Joint Swelling, Morning Stiffness, Muscle Pain, Muscle Weakness and Spasms. Neurological Not Present- Blackout spells, Difficulty with balance, Dizziness, Paralysis, Tremor and Weakness. Psychiatric Not Present- Insomnia.  Vitals Weight: 189 lb Height: 65in Weight was reported by patient. Height was reported by patient. Body Surface Area: 1.93 m Body Mass Index: 31.45 kg/m  BP: 152/78 (Sitting, Right Arm, Standard)  Physical Exam General Mental Status -Alert, cooperative and good historian. General Appearance-pleasant, Not in acute distress. Orientation-Oriented X3. Build &  Nutrition-Well nourished and Well developed.  Head and Neck Head-normocephalic, atraumatic . Neck Global Assessment - supple, no bruit auscultated on the right, no bruit auscultated on the left.  Eye Vision-Wears corrective lenses. Pupil - Bilateral-Regular and Round. Motion - Bilateral-EOMI.  Chest and Lung Exam Auscultation Breath sounds - clear at anterior chest wall and clear at posterior chest wall. Adventitious sounds - No Adventitious sounds.  Cardiovascular Auscultation Rhythm - Regular rate and rhythm. Heart Sounds - S1 WNL and S2 WNL. Murmurs & Other Heart Sounds - Auscultation of the heart reveals - No Murmurs.  Abdomen Palpation/Percussion Tenderness - Abdomen is non-tender to palpation. Rigidity (guarding) - Abdomen is soft. Auscultation Auscultation of the abdomen reveals - Bowel sounds normal.  Female Genitourinary Note: Not done, not pertinent to present illness  Musculoskeletal Note: On exam, she is alert and oriented, no apparent distress. Evaluation of her right hip flexion 110, minimal internal rotation about 20, external rotation 30 abduction. Left hip has normal range of motion.  RADIOGRAPH Radiographs showed she has significant joint space narrowing, not fully bone- on-bone, but very close to bone-on-bone.  Assessment & Plan Primary osteoarthritis of right hip (M16.11) Note:Surgical Plans: Right Total Hip Replacement - Anterior Approach  Disposition: Home  PCP: Henreitta Leber NP  IV TXA  Anesthesia Issues: None  Signed electronically by Lauraine Rinne, III PA-C

## 2014-11-08 NOTE — Progress Notes (Signed)
Preoperative surgical orders have been place into the Epic hospital system for Stacey Hernandez on 11/08/2014, 11:54 AM  by Patrica Duel for surgery on 12-07-2014.  Preop Total Hip - Anterior Approach orders including IV Tylenol, and IV Decadron as long as there are no contraindications to the above medications. Avel Peace, PA-C

## 2014-11-25 ENCOUNTER — Encounter (HOSPITAL_COMMUNITY)
Admission: RE | Admit: 2014-11-25 | Discharge: 2014-11-25 | Disposition: A | Discharge status: Home / Self Care | Payer: BLUE CROSS/BLUE SHIELD | Source: Ambulatory Visit | Attending: Orthopedic Surgery | Admitting: Orthopedic Surgery

## 2014-11-25 ENCOUNTER — Encounter (HOSPITAL_COMMUNITY): Payer: Self-pay

## 2014-11-25 DIAGNOSIS — M1611 Unilateral primary osteoarthritis, right hip: Secondary | ICD-10-CM | POA: Insufficient documentation

## 2014-11-25 DIAGNOSIS — Z01818 Encounter for other preprocedural examination: Secondary | ICD-10-CM | POA: Diagnosis not present

## 2014-11-25 HISTORY — DX: Unspecified osteoarthritis, unspecified site: M19.90

## 2014-11-25 HISTORY — DX: Unspecified cataract: H26.9

## 2014-11-25 LAB — COMPREHENSIVE METABOLIC PANEL
ALBUMIN: 4.2 g/dL (ref 3.5–5.0)
ALT: 20 U/L (ref 14–54)
AST: 27 U/L (ref 15–41)
Alkaline Phosphatase: 51 U/L (ref 38–126)
Anion gap: 5 (ref 5–15)
BUN: 12 mg/dL (ref 6–20)
CHLORIDE: 104 mmol/L (ref 101–111)
CO2: 29 mmol/L (ref 22–32)
CREATININE: 0.78 mg/dL (ref 0.44–1.00)
Calcium: 9.3 mg/dL (ref 8.9–10.3)
GFR calc Af Amer: >60 mL/min (ref >60)
GFR calc non Af Amer: >60 mL/min (ref >60)
GLUCOSE: 84 mg/dL (ref 65–99)
Potassium: 4.1 mmol/L (ref 3.5–5.1)
SODIUM: 138 mmol/L (ref 135–145)
Total Bilirubin: 0.8 mg/dL (ref 0.3–1.2)
Total Protein: 7.4 g/dL (ref 6.5–8.1)

## 2014-11-25 LAB — URINALYSIS, ROUTINE W REFLEX MICROSCOPIC
Bilirubin Urine: NEGATIVE
GLUCOSE, UA: NEGATIVE mg/dL
Hgb urine dipstick: NEGATIVE
KETONES UR: NEGATIVE mg/dL
LEUKOCYTES UA: NEGATIVE
NITRITE: NEGATIVE
PROTEIN: NEGATIVE mg/dL
Specific Gravity, Urine: 1.006 (ref 1.005–1.030)
Urobilinogen, UA: 0.2 mg/dL (ref 0.0–1.0)
pH: 7 (ref 5.0–8.0)

## 2014-11-25 LAB — SURGICAL PCR SCREEN
MRSA, PCR: NEGATIVE
Staphylococcus aureus: POSITIVE — AB

## 2014-11-25 LAB — CBC
HCT: 42.5 % (ref 36.0–46.0)
Hemoglobin: 14.1 g/dL (ref 12.0–15.0)
MCH: 31.6 pg (ref 26.0–34.0)
MCHC: 33.2 g/dL (ref 30.0–36.0)
MCV: 95.3 fL (ref 78.0–100.0)
PLATELETS: 242 10*3/uL (ref 150–400)
RBC: 4.46 MIL/uL (ref 3.87–5.11)
RDW: 13.2 % (ref 11.5–15.5)
WBC: 10.2 10*3/uL (ref 4.0–10.5)

## 2014-11-25 LAB — ABO/RH: ABO/RH(D): A POS

## 2014-11-25 LAB — TYPE AND SCREEN
ABO/RH(D): A POS
Antibody Screen: NEGATIVE

## 2014-11-25 LAB — APTT: aPTT: 34 seconds (ref 24–37)

## 2014-11-25 LAB — PROTIME-INR
INR: 0.96 (ref 0.00–1.49)
Prothrombin Time: 13 seconds (ref 11.6–15.2)

## 2014-11-25 NOTE — Patient Instructions (Signed)
Stacey Hernandez  11/25/2014   Your procedure is scheduled on: Wednesday December 07, 2014  Report to [facility] Main  Entrance take [place]  elevators to 3rd floor to  Short Stay Center at 11:30 AM.  Call this number if you have problems the morning of surgery [phone]   Remember: ONLY 1 PERSON MAY GO WITH YOU TO SHORT STAY TO GET  READY MORNING OF YOUR SURGERY.  Do not eat food After Midnight but may take clear liquids till 7:30 am day of surgery then nothing by mouth.      Take these medicines the morning of surgery with A SIP OF WATER: Hydrocodone-Acetaminophen if needed; Levothyroxine; Singulair if needed                               You may not have any metal on your body including hair pins and              piercings  Do not wear jewelry, make-up, lotions, powders or perfumes, deodorant             Do not wear nail polish.  Do not shave  48 hours prior to surgery.                Do not bring valuables to the hospital. [facility] IS NOT             RESPONSIBLE   FOR VALUABLES.  Contacts, dentures or bridgework may not be worn into surgery.  Leave suitcase in the car. After surgery it may be brought to your room.                Please read over the following fact sheets you were given:MRSA INFORMATION SHEET; INCENTIVE SPIROMETER; BLOOD TRANSFUSION INFORMATION SHEET _____________________________________________________________________             [facility] - Preparing for Surgery Before surgery, you can play an important role.  Because skin is not sterile, your skin needs to be as free of germs as possible.  You can reduce the number of germs on your skin by washing with CHG (chlorahexidine gluconate) soap before surgery.  CHG is an antiseptic cleaner which kills germs and bonds with the skin to continue killing germs even after washing. Please DO NOT use if you have an allergy to CHG or antibacterial soaps.  If your skin becomes reddened/irritated stop  using the CHG and inform your nurse when you arrive at Short Stay. Do not shave (including legs and underarms) for at least 48 hours prior to the first CHG shower.  You may shave your face/neck. Please follow these instructions carefully:  1.  Shower with CHG Soap the night before surgery and the  morning of Surgery.  2.  If you choose to wash your hair, wash your hair first as usual with your  normal  shampoo.  3.  After you shampoo, rinse your hair and body thoroughly to remove the  shampoo.                           4.  Use CHG as you would any other liquid soap.  You can apply chg directly  to the skin and wash  Gently with a scrungie or clean washcloth.  5.  Apply the CHG Soap to your body ONLY FROM THE NECK DOWN.   Do not use on face/ open                           Wound or open sores. Avoid contact with eyes, ears mouth and genitals (private parts).                       Wash face,  Genitals (private parts) with your normal soap.             6.  Wash thoroughly, paying special attention to the area where your surgery  will be performed.  7.  Thoroughly rinse your body with warm water from the neck down.  8.  DO NOT shower/wash with your normal soap after using and rinsing off  the CHG Soap.                9.  Pat yourself dry with a clean towel.            10.  Wear clean pajamas.            11.  Place clean sheets on your bed the night of your first shower and do not  sleep with pets. Day of Surgery : Do not apply any lotions/deodorants the morning of surgery.  Please wear clean clothes to the hospital/surgery center.  FAILURE TO FOLLOW THESE INSTRUCTIONS MAY RESULT IN THE CANCELLATION OF YOUR SURGERY PATIENT SIGNATURE_________________________________  NURSE SIGNATURE__________________________________  ________________________________________________________________________    CLEAR LIQUID DIET   Foods Allowed                                                                      Foods Excluded  Coffee and tea, regular and decaf                             liquids that you cannot  Plain Jell-O in any flavor                                             see through such as: Fruit ices (not with fruit pulp)                                     milk, soups, orange juice  Iced Popsicles                                    All solid food Carbonated beverages, regular and diet                                    Cranberry, grape and apple juices Sports drinks like Gatorade Lightly seasoned clear broth or consume(fat free) Sugar, honey syrup  Sample Menu Breakfast                                Lunch                                     Supper Cranberry juice                    Beef broth                            Chicken broth Jell-O                                     Grape juice                           Apple juice Coffee or tea                        Jell-O                                      Popsicle                                                Coffee or tea                        Coffee or tea  _____________________________________________________________________    Incentive Spirometer  An incentive spirometer is a tool that can help keep your lungs clear and active. This tool measures how well you are filling your lungs with each breath. Taking long deep breaths may help reverse or decrease the chance of developing breathing (pulmonary) problems (especially infection) following:  A long period of time when you are unable to move or be active. BEFORE THE PROCEDURE   If the spirometer includes an indicator to show your best effort, your nurse or respiratory therapist will set it to a desired goal.  If possible, sit up straight or lean slightly forward. Try not to slouch.  Hold the incentive spirometer in an upright position. INSTRUCTIONS FOR USE   Sit on the edge of your bed if possible, or sit up as far as you can in bed or on a  chair.  Hold the incentive spirometer in an upright position.  Breathe out normally.  Place the mouthpiece in your mouth and seal your lips tightly around it.  Breathe in slowly and as deeply as possible, raising the piston or the ball toward the top of the column.  Hold your breath for 3-5 seconds or for as long as possible. Allow the piston or ball to fall to the bottom of the column.  Remove the mouthpiece from your mouth and breathe out normally.  Rest for a few seconds and repeat Steps 1 through 7 at least 10 times every 1-2 hours when you are awake. Take your time and take a few normal breaths between  deep breaths.  The spirometer may include an indicator to show your best effort. Use the indicator as a goal to work toward during each repetition.  After each set of 10 deep breaths, practice coughing to be sure your lungs are clear. If you have an incision (the cut made at the time of surgery), support your incision when coughing by placing a pillow or rolled up towels firmly against it. Once you are able to get out of bed, walk around indoors and cough well. You may stop using the incentive spirometer when instructed by your caregiver.  RISKS AND COMPLICATIONS  Take your time so you do not get dizzy or light-headed.  If you are in pain, you may need to take or ask for pain medication before doing incentive spirometry. It is harder to take a deep breath if you are having pain. AFTER USE  Rest and breathe slowly and easily.  It can be helpful to keep track of a log of your progress. Your caregiver can provide you with a simple table to help with this. If you are using the spirometer at home, follow these instructions: [place] IF:   You are having difficultly using the spirometer.  You have trouble using the spirometer as often as instructed.  Your pain medication is not giving enough relief while using the spirometer.  You develop fever of 100.5 F (38.1 C) or  higher. SEEK IMMEDIATE MEDICAL CARE IF:   You cough up bloody sputum that had not been present before.  You develop fever of 102 F (38.9 C) or greater.  You develop worsening pain at or near the incision site. MAKE SURE YOU:   Understand these instructions.  Will watch your condition.  Will get help right away if you are not doing well or get worse. Document Released: 06/24/2006 Document Revised: 05/06/2011 Document Reviewed: 08/25/2006 Exit[employer] Patient Information 2014 Exit[employer], [place].   ________________________________________________________________________  WHAT IS A BLOOD TRANSFUSION? Blood Transfusion Information  A transfusion is the replacement of blood or some of its parts. Blood is made up of multiple cells which provide different functions.  Red blood cells carry oxygen and are used for blood loss replacement.  White blood cells fight against infection.  Platelets control bleeding.  Plasma helps clot blood.  Other blood products are available for specialized needs, such as hemophilia or other clotting disorders. BEFORE THE TRANSFUSION  Who gives blood for transfusions?   Healthy volunteers who are fully evaluated to make sure their blood is safe. This is blood bank blood. Transfusion therapy is the safest it has ever been in the practice of medicine. Before blood is taken from a donor, a complete history is taken to make sure that person has no history of diseases nor engages in risky social behavior (examples are intravenous drug use or sexual activity with multiple partners). The donor's travel history is screened to minimize risk of transmitting infections, such as malaria. The donated blood is tested for signs of infectious diseases, such as HIV and hepatitis. The blood is then tested to be sure it is compatible with you in order to minimize the chance of a transfusion reaction. If you or a relative donates blood, this is often done in anticipation of surgery  and is not appropriate for emergency situations. It takes many days to process the donated blood. RISKS AND COMPLICATIONS Although transfusion therapy is very safe and saves many lives, the main dangers of transfusion include:   Getting an infectious disease.  Developing a transfusion reaction. This is an allergic reaction to something in the blood you were given. Every precaution is taken to prevent this. The decision to have a blood transfusion has been considered carefully by your caregiver before blood is given. Blood is not given unless the benefits outweigh the risks. AFTER THE TRANSFUSION  Right after receiving a blood transfusion, you will usually feel much better and more energetic. This is especially true if your red blood cells have gotten low (anemic). The transfusion raises the level of the red blood cells which carry oxygen, and this usually causes an energy increase.  The nurse administering the transfusion will monitor you carefully for complications. HOME CARE INSTRUCTIONS  No special instructions are needed after a transfusion. You may find your energy is better. Speak with your caregiver about any limitations on activity for underlying diseases you may have. SEEK MEDICAL CARE IF:   Your condition is not improving after your transfusion.  You develop redness or irritation at the intravenous (IV) site. SEEK IMMEDIATE MEDICAL CARE IF:  Any of the following symptoms occur over the next 12 hours:  Shaking chills.  You have a temperature by mouth above 102 F (38.9 C), not controlled by medicine.  Chest, back, or muscle pain.  People around you feel you are not acting correctly or are confused.  Shortness of breath or difficulty breathing.  Dizziness and fainting.  You get a rash or develop hives.  You have a decrease in urine output.  Your urine turns a dark color or changes to pink, red, or brown. Any of the following symptoms occur over the next 10  days:  You have a temperature by mouth above 102 F (38.9 C), not controlled by medicine.  Shortness of breath.  Weakness after normal activity.  The white part of the eye turns yellow (jaundice).  You have a decrease in the amount of urine or are urinating less often.  Your urine turns a dark color or changes to pink, red, or brown. Document Released: 02/09/2000 Document Revised: 05/06/2011 Document Reviewed: 09/28/2007 [facility] Patient Information 2014 [place], [place].  _______________________________________________________________________

## 2014-11-28 NOTE — Progress Notes (Signed)
Surgical screening results in epic per PAT visit 11/25/2014 sent to Dr Lequita Halt. Prescription for Mupriocin Ointment called to [facility]-spoke with [place]/pharmacist. Pt is aware.

## 2014-12-07 ENCOUNTER — Encounter (HOSPITAL_COMMUNITY)
Admission: RE | Disposition: A | Discharge status: Home Health | Payer: Self-pay | Source: Ambulatory Visit | Attending: Orthopedic Surgery

## 2014-12-07 ENCOUNTER — Encounter (HOSPITAL_COMMUNITY): Payer: Self-pay | Admitting: *Deleted

## 2014-12-07 ENCOUNTER — Inpatient Hospital Stay (HOSPITAL_COMMUNITY): Payer: BLUE CROSS/BLUE SHIELD | Admitting: Anesthesiology

## 2014-12-07 ENCOUNTER — Inpatient Hospital Stay (HOSPITAL_COMMUNITY): Payer: BLUE CROSS/BLUE SHIELD

## 2014-12-07 ENCOUNTER — Inpatient Hospital Stay (HOSPITAL_COMMUNITY)
Admission: RE | Admit: 2014-12-07 | Discharge: 2014-12-08 | DRG: 470 | Disposition: A | Discharge status: Home Health | Payer: BLUE CROSS/BLUE SHIELD | Source: Ambulatory Visit | Attending: Orthopedic Surgery | Admitting: Orthopedic Surgery

## 2014-12-07 DIAGNOSIS — Z8249 Family history of ischemic heart disease and other diseases of the circulatory system: Secondary | ICD-10-CM

## 2014-12-07 DIAGNOSIS — M169 Osteoarthritis of hip, unspecified: Secondary | ICD-10-CM | POA: Diagnosis present

## 2014-12-07 DIAGNOSIS — E039 Hypothyroidism, unspecified: Secondary | ICD-10-CM | POA: Diagnosis present

## 2014-12-07 DIAGNOSIS — M25551 Pain in right hip: Secondary | ICD-10-CM | POA: Diagnosis present

## 2014-12-07 DIAGNOSIS — Z96649 Presence of unspecified artificial hip joint: Secondary | ICD-10-CM

## 2014-12-07 DIAGNOSIS — I10 Essential (primary) hypertension: Secondary | ICD-10-CM | POA: Diagnosis present

## 2014-12-07 DIAGNOSIS — M1611 Unilateral primary osteoarthritis, right hip: Secondary | ICD-10-CM

## 2014-12-07 HISTORY — PX: TOTAL HIP ARTHROPLASTY: SHX124

## 2014-12-07 SURGERY — ARTHROPLASTY, HIP, TOTAL, ANTERIOR APPROACH
Anesthesia: Spinal | Site: Hip | Laterality: Right

## 2014-12-07 MED ORDER — CEFAZOLIN SODIUM-DEXTROSE 2-3 GM-% IV SOLR
2.0000 g | Freq: Four times a day (QID) | INTRAVENOUS | Status: AC
  Administered 2014-12-07 – 2014-12-08 (×2): 2 g via INTRAVENOUS
  Filled 2014-12-07 (×2): qty 50

## 2014-12-07 MED ORDER — HYDROMORPHONE HCL 1 MG/ML IJ SOLN
0.2500 mg | INTRAMUSCULAR | Status: DC | PRN
  Administered 2014-12-07: 0.25 mg via INTRAVENOUS
  Administered 2014-12-07: 0.5 mg via INTRAVENOUS
  Administered 2014-12-07 (×3): 0.25 mg via INTRAVENOUS

## 2014-12-07 MED ORDER — PROPOFOL 10 MG/ML IV BOLUS
INTRAVENOUS | Status: AC
  Filled 2014-12-07: qty 20

## 2014-12-07 MED ORDER — CHLORHEXIDINE GLUCONATE 4 % EX LIQD: 60.0000 mL | Freq: Once | CUTANEOUS | Status: DC

## 2014-12-07 MED ORDER — DEXAMETHASONE SODIUM PHOSPHATE 10 MG/ML IJ SOLN
10.0000 mg | Freq: Once | INTRAMUSCULAR | Status: AC
  Administered 2014-12-07: 10 mg via INTRAVENOUS

## 2014-12-07 MED ORDER — BUPIVACAINE HCL (PF) 0.25 % IJ SOLN
INTRAMUSCULAR | Status: AC
  Filled 2014-12-07: qty 30

## 2014-12-07 MED ORDER — ACETAMINOPHEN 325 MG PO TABS: 650.0000 mg | ORAL_TABLET | Freq: Four times a day (QID) | ORAL | Status: DC | PRN

## 2014-12-07 MED ORDER — SUFENTANIL CITRATE 50 MCG/ML IV SOLN
INTRAVENOUS | Status: DC | PRN
  Administered 2014-12-07: 10 ug via INTRAVENOUS
  Administered 2014-12-07: 20 ug via INTRAVENOUS
  Administered 2014-12-07 (×2): 10 ug via INTRAVENOUS

## 2014-12-07 MED ORDER — ACETAMINOPHEN 650 MG RE SUPP: 650.0000 mg | Freq: Four times a day (QID) | RECTAL | Status: DC | PRN

## 2014-12-07 MED ORDER — DEXTROSE 5 % IV SOLN
500.0000 mg | Freq: Four times a day (QID) | INTRAVENOUS | Status: DC | PRN
  Administered 2014-12-07: 500 mg via INTRAVENOUS
  Filled 2014-12-07 (×2): qty 5

## 2014-12-07 MED ORDER — SUGAMMADEX SODIUM 200 MG/2ML IV SOLN
INTRAVENOUS | Status: DC | PRN
  Administered 2014-12-07: 170 mg via INTRAVENOUS

## 2014-12-07 MED ORDER — ACETAMINOPHEN 10 MG/ML IV SOLN
INTRAVENOUS | Status: AC
  Filled 2014-12-07: qty 100

## 2014-12-07 MED ORDER — BUPIVACAINE HCL (PF) 0.25 % IJ SOLN
INTRAMUSCULAR | Status: DC | PRN
  Administered 2014-12-07: 30 mL

## 2014-12-07 MED ORDER — SUGAMMADEX SODIUM 200 MG/2ML IV SOLN
INTRAVENOUS | Status: AC
  Filled 2014-12-07: qty 2

## 2014-12-07 MED ORDER — SODIUM CHLORIDE 0.9 % IV SOLN: INTRAVENOUS | Status: DC

## 2014-12-07 MED ORDER — ACETAMINOPHEN 500 MG PO TABS
1000.0000 mg | ORAL_TABLET | Freq: Four times a day (QID) | ORAL | Status: DC
  Administered 2014-12-07 – 2014-12-08 (×3): 1000 mg via ORAL
  Filled 2014-12-07 (×4): qty 2

## 2014-12-07 MED ORDER — ONDANSETRON HCL 4 MG/2ML IJ SOLN
INTRAMUSCULAR | Status: DC | PRN
  Administered 2014-12-07: 4 mg via INTRAVENOUS

## 2014-12-07 MED ORDER — ACETAMINOPHEN 10 MG/ML IV SOLN
1000.0000 mg | Freq: Once | INTRAVENOUS | Status: AC
  Administered 2014-12-07: 1000 mg via INTRAVENOUS
  Filled 2014-12-07: qty 100

## 2014-12-07 MED ORDER — SUFENTANIL CITRATE 50 MCG/ML IV SOLN
INTRAVENOUS | Status: AC
  Filled 2014-12-07: qty 1

## 2014-12-07 MED ORDER — METHOCARBAMOL 500 MG PO TABS
500.0000 mg | ORAL_TABLET | Freq: Four times a day (QID) | ORAL | Status: DC | PRN
  Administered 2014-12-07 – 2014-12-08 (×4): 500 mg via ORAL
  Filled 2014-12-07 (×4): qty 1

## 2014-12-07 MED ORDER — ROCURONIUM BROMIDE 100 MG/10ML IV SOLN
INTRAVENOUS | Status: DC | PRN
  Administered 2014-12-07: 40 mg via INTRAVENOUS

## 2014-12-07 MED ORDER — OXYCODONE HCL 5 MG PO TABS
5.0000 mg | ORAL_TABLET | ORAL | Status: DC | PRN
  Administered 2014-12-07 – 2014-12-08 (×8): 10 mg via ORAL
  Filled 2014-12-07 (×9): qty 2

## 2014-12-07 MED ORDER — ONDANSETRON HCL 4 MG PO TABS
4.0000 mg | ORAL_TABLET | Freq: Four times a day (QID) | ORAL | Status: DC | PRN
Start: 2014-12-07 — End: 2014-12-08

## 2014-12-07 MED ORDER — LIDOCAINE HCL (CARDIAC) 20 MG/ML IV SOLN
INTRAVENOUS | Status: DC | PRN
  Administered 2014-12-07: 100 mg via INTRAVENOUS

## 2014-12-07 MED ORDER — METOCLOPRAMIDE HCL 5 MG/ML IJ SOLN: 5.0000 mg | Freq: Three times a day (TID) | INTRAMUSCULAR | Status: DC | PRN

## 2014-12-07 MED ORDER — HYDROMORPHONE HCL 1 MG/ML IJ SOLN
INTRAMUSCULAR | Status: AC
  Filled 2014-12-07: qty 1

## 2014-12-07 MED ORDER — TRANEXAMIC ACID 1000 MG/10ML IV SOLN
1000.0000 mg | INTRAVENOUS | Status: AC
  Administered 2014-12-07: 1000 mg via INTRAVENOUS
  Filled 2014-12-07: qty 10

## 2014-12-07 MED ORDER — BISACODYL 10 MG RE SUPP: 10.0000 mg | Freq: Every day | RECTAL | Status: DC | PRN

## 2014-12-07 MED ORDER — ONDANSETRON HCL 4 MG/2ML IJ SOLN: 4.0000 mg | Freq: Four times a day (QID) | INTRAMUSCULAR | Status: DC | PRN

## 2014-12-07 MED ORDER — DOCUSATE SODIUM 100 MG PO CAPS
100.0000 mg | ORAL_CAPSULE | Freq: Two times a day (BID) | ORAL | Status: DC
  Administered 2014-12-07 – 2014-12-08 (×2): 100 mg via ORAL

## 2014-12-07 MED ORDER — POLYETHYLENE GLYCOL 3350 17 G PO PACK: 17.0000 g | PACK | Freq: Every day | ORAL | Status: DC | PRN

## 2014-12-07 MED ORDER — DEXTROSE-NACL 5-0.9 % IV SOLN
INTRAVENOUS | Status: DC
  Administered 2014-12-07: 75 mL/h via INTRAVENOUS

## 2014-12-07 MED ORDER — LACTATED RINGERS IV SOLN: INTRAVENOUS | Status: DC

## 2014-12-07 MED ORDER — SUCCINYLCHOLINE CHLORIDE 20 MG/ML IJ SOLN
INTRAMUSCULAR | Status: DC | PRN
  Administered 2014-12-07: 80 mg via INTRAVENOUS

## 2014-12-07 MED ORDER — ONDANSETRON HCL 4 MG/2ML IJ SOLN
INTRAMUSCULAR | Status: AC
  Filled 2014-12-07: qty 2

## 2014-12-07 MED ORDER — CEFAZOLIN SODIUM-DEXTROSE 2-3 GM-% IV SOLR
INTRAVENOUS | Status: AC
  Filled 2014-12-07: qty 50

## 2014-12-07 MED ORDER — MORPHINE SULFATE (PF) 2 MG/ML IV SOLN: 1.0000 mg | INTRAVENOUS | Status: DC | PRN

## 2014-12-07 MED ORDER — METOCLOPRAMIDE HCL 5 MG PO TABS
5.0000 mg | ORAL_TABLET | Freq: Three times a day (TID) | ORAL | Status: DC | PRN
  Filled 2014-12-07: qty 2

## 2014-12-07 MED ORDER — MORPHINE SULFATE (PF) 10 MG/ML IV SOLN: 1.0000 mg | INTRAVENOUS | Status: DC | PRN

## 2014-12-07 MED ORDER — FLEET ENEMA 7-19 GM/118ML RE ENEM: 1.0000 | ENEMA | Freq: Once | RECTAL | Status: DC | PRN

## 2014-12-07 MED ORDER — MIDAZOLAM HCL 2 MG/2ML IJ SOLN
INTRAMUSCULAR | Status: AC
  Filled 2014-12-07: qty 4

## 2014-12-07 MED ORDER — DEXAMETHASONE SODIUM PHOSPHATE 10 MG/ML IJ SOLN
INTRAMUSCULAR | Status: AC
  Filled 2014-12-07: qty 1

## 2014-12-07 MED ORDER — TRAMADOL HCL 50 MG PO TABS: 50.0000 mg | ORAL_TABLET | Freq: Four times a day (QID) | ORAL | Status: DC | PRN

## 2014-12-07 MED ORDER — MONTELUKAST SODIUM 10 MG PO TABS
10.0000 mg | ORAL_TABLET | Freq: Every day | ORAL | Status: DC
  Filled 2014-12-07: qty 1

## 2014-12-07 MED ORDER — CEFAZOLIN SODIUM-DEXTROSE 2-3 GM-% IV SOLR
2.0000 g | INTRAVENOUS | Status: AC
  Administered 2014-12-07: 2 g via INTRAVENOUS

## 2014-12-07 MED ORDER — MENTHOL 3 MG MT LOZG: 1.0000 | LOZENGE | OROMUCOSAL | Status: DC | PRN

## 2014-12-07 MED ORDER — EPHEDRINE SULFATE 50 MG/ML IJ SOLN
INTRAMUSCULAR | Status: DC | PRN
  Administered 2014-12-07: 5 mg via INTRAVENOUS

## 2014-12-07 MED ORDER — LEVOTHYROXINE SODIUM 112 MCG PO TABS
112.0000 ug | ORAL_TABLET | Freq: Every day | ORAL | Status: DC
  Administered 2014-12-08: 112 ug via ORAL
  Filled 2014-12-07 (×2): qty 1

## 2014-12-07 MED ORDER — LACTATED RINGERS IV SOLN
INTRAVENOUS | Status: DC
  Administered 2014-12-07 (×3): via INTRAVENOUS

## 2014-12-07 MED ORDER — PROPOFOL 10 MG/ML IV BOLUS
INTRAVENOUS | Status: DC | PRN
  Administered 2014-12-07: 150 mg via INTRAVENOUS

## 2014-12-07 MED ORDER — DIPHENHYDRAMINE HCL 12.5 MG/5ML PO ELIX: 12.5000 mg | ORAL_SOLUTION | ORAL | Status: DC | PRN

## 2014-12-07 MED ORDER — SODIUM CHLORIDE 0.9 % IJ SOLN
INTRAMUSCULAR | Status: AC
Start: 2014-12-07 — End: 2014-12-07
  Filled 2014-12-07: qty 10

## 2014-12-07 MED ORDER — MIDAZOLAM HCL 2 MG/2ML IJ SOLN
INTRAMUSCULAR | Status: DC | PRN
  Administered 2014-12-07: 2 mg via INTRAVENOUS

## 2014-12-07 MED ORDER — RIVAROXABAN 10 MG PO TABS
10.0000 mg | ORAL_TABLET | Freq: Every day | ORAL | Status: DC
  Administered 2014-12-08: 10 mg via ORAL
  Filled 2014-12-07 (×2): qty 1

## 2014-12-07 MED ORDER — PHENOL 1.4 % MT LIQD: 1.0000 | OROMUCOSAL | Status: DC | PRN

## 2014-12-07 MED ORDER — DEXAMETHASONE SODIUM PHOSPHATE 10 MG/ML IJ SOLN
10.0000 mg | Freq: Once | INTRAMUSCULAR | Status: AC
  Administered 2014-12-08: 10 mg via INTRAVENOUS
  Filled 2014-12-07 (×2): qty 1

## 2014-12-07 SURGICAL SUPPLY — 45 items
BAG DECANTER FOR FLEXI CONT (MISCELLANEOUS) ×3 IMPLANT
BAG SPEC THK2 15X12 ZIP CLS (MISCELLANEOUS) ×1
BAG ZIPLOCK 12X15 (MISCELLANEOUS) ×2 IMPLANT
BLADE EXTENDED COATED 6.5IN (ELECTRODE) ×3 IMPLANT
BLADE SAG 18X100X1.27 (BLADE) ×3 IMPLANT
CAPT HIP TOTAL 2 ×2 IMPLANT
CLOSURE WOUND 1/2 X4 (GAUZE/BANDAGES/DRESSINGS) ×1
COVER PERINEAL POST (MISCELLANEOUS) ×3 IMPLANT
DECANTER SPIKE VIAL GLASS SM (MISCELLANEOUS) ×3 IMPLANT
DRAPE C-ARM 42X120 X-RAY (DRAPES) ×3 IMPLANT
DRAPE STERI IOBAN 125X83 (DRAPES) ×3 IMPLANT
DRAPE U-SHAPE 47X51 STRL (DRAPES) ×9 IMPLANT
DRSG ADAPTIC 3X8 NADH LF (GAUZE/BANDAGES/DRESSINGS) ×3 IMPLANT
DRSG MEPILEX BORDER 4X4 (GAUZE/BANDAGES/DRESSINGS) ×3 IMPLANT
DRSG MEPILEX BORDER 4X8 (GAUZE/BANDAGES/DRESSINGS) ×3 IMPLANT
DURAPREP 26ML APPLICATOR (WOUND CARE) ×3 IMPLANT
ELECT REM PT RETURN 9FT ADLT (ELECTROSURGICAL) ×3
ELECTRODE REM PT RTRN 9FT ADLT (ELECTROSURGICAL) ×1 IMPLANT
EVACUATOR 1/8 PVC DRAIN (DRAIN) ×3 IMPLANT
FACESHIELD WRAPAROUND (MASK) ×12 IMPLANT
FACESHIELD WRAPAROUND OR TEAM (MASK) ×4 IMPLANT
GLOVE BIO SURGEON STRL SZ7.5 (GLOVE) ×3 IMPLANT
GLOVE BIO SURGEON STRL SZ8 (GLOVE) ×6 IMPLANT
GLOVE BIOGEL PI IND STRL 8 (GLOVE) ×2 IMPLANT
GLOVE BIOGEL PI INDICATOR 8 (GLOVE) ×4
GOWN STRL REUS W/TWL LRG LVL3 (GOWN DISPOSABLE) ×3 IMPLANT
GOWN STRL REUS W/TWL XL LVL3 (GOWN DISPOSABLE) ×3 IMPLANT
KIT BASIN OR (CUSTOM PROCEDURE TRAY) ×3 IMPLANT
NDL SAFETY ECLIPSE 18X1.5 (NEEDLE) ×1 IMPLANT
NEEDLE HYPO 18GX1.5 SHARP (NEEDLE) ×3
PACK TOTAL JOINT (CUSTOM PROCEDURE TRAY) ×3 IMPLANT
PEN SKIN MARKING BROAD (MISCELLANEOUS) ×3 IMPLANT
STRIP CLOSURE SKIN 1/2X4 (GAUZE/BANDAGES/DRESSINGS) ×2 IMPLANT
SUT ETHIBOND NAB CT1 #1 30IN (SUTURE) ×3 IMPLANT
SUT MNCRL AB 4-0 PS2 18 (SUTURE) ×3 IMPLANT
SUT VIC AB 2-0 CT1 27 (SUTURE) ×6
SUT VIC AB 2-0 CT1 TAPERPNT 27 (SUTURE) ×2 IMPLANT
SUT VLOC 180 0 24IN GS25 (SUTURE) ×3 IMPLANT
SYR 30ML LL (SYRINGE) ×3 IMPLANT
SYR 50ML LL SCALE MARK (SYRINGE) IMPLANT
TOWEL OR 17X26 10 PK STRL BLUE (TOWEL DISPOSABLE) ×3 IMPLANT
TOWEL OR NON WOVEN STRL DISP B (DISPOSABLE) ×2 IMPLANT
TRAY FOLEY W/METER SILVER 14FR (SET/KITS/TRAYS/PACK) ×3 IMPLANT
TRAY FOLEY W/METER SILVER 16FR (SET/KITS/TRAYS/PACK) ×1 IMPLANT
YANKAUER SUCT BULB TIP 10FT TU (MISCELLANEOUS) IMPLANT

## 2014-12-07 NOTE — Anesthesia Preprocedure Evaluation (Signed)
Anesthesia Evaluation  Patient identified by MRN, date of birth, ID band Patient awake    Reviewed: Allergy & Precautions, H&P , NPO status , Patient's Chart, lab work & pertinent test results  Airway Mallampati: I  TM Distance: >3 FB Neck ROM: Full    Dental no notable dental hx. (+) Dental Advisory Given   Pulmonary neg pulmonary ROS,    Pulmonary exam normal breath sounds clear to auscultation       Cardiovascular hypertension, Pt. on medications Normal cardiovascular exam Rhythm:Regular Rate:Normal     Neuro/Psych negative neurological ROS  negative psych ROS   GI/Hepatic negative GI ROS, Neg liver ROS,   Endo/Other  negative endocrine ROSHypothyroidism   Renal/GU negative Renal ROS  negative genitourinary   Musculoskeletal negative musculoskeletal ROS (+)   Abdominal   Peds negative pediatric ROS (+)  Hematology negative hematology ROS (+)   Anesthesia Other Findings   Reproductive/Obstetrics negative OB ROS                             Anesthesia Physical Anesthesia Plan  ASA: II  Anesthesia Plan: Spinal   Post-op Pain Management:    Induction:   Airway Management Planned:   Additional Equipment:   Intra-op Plan:   Post-operative Plan:   Informed Consent:   Plan Discussed with: Surgeon  Anesthesia Plan Comments:         Anesthesia Quick Evaluation

## 2014-12-07 NOTE — Interval H&P Note (Signed)
History and Physical Interval Note:  12/07/2014 1:41 PM  Stacey Hernandez  has presented today for surgery, with the diagnosis of right hip osteoarthritis  The various methods of treatment have been discussed with the patient and family. After consideration of risks, benefits and other options for treatment, the patient has consented to  Procedure(s): RIGHT TOTAL HIP ARTHROPLASTY ANTERIOR APPROACH (Right) as a surgical intervention .  The patient's history has been reviewed, patient examined, no change in status, stable for surgery.  I have reviewed the patient's chart and labs.  Questions were answered to the patient's satisfaction.     Loanne DrillingALUISIO,Nealie Mchatton V

## 2014-12-07 NOTE — Op Note (Signed)
OPERATIVE REPORT  PREOPERATIVE DIAGNOSIS: Osteoarthritis of the Right hip.   POSTOPERATIVE DIAGNOSIS: Osteoarthritis of the Right  hip.   PROCEDURE: Right total hip arthroplasty, anterior approach.   SURGEON: Ollen Gross, MD   ASSISTANT: Avel Peace, PA-C  ANESTHESIA:  Spinal  ESTIMATED BLOOD LOSS:-550 ml    DRAINS: Hemovac x1.   COMPLICATIONS: None   CONDITION: PACU - hemodynamically stable.   BRIEF CLINICAL NOTE: Stacey Hernandez is a 59 y.o. female who has advanced end-  stage arthritis of her Right  hip with progressively worsening pain and  dysfunction.The patient has failed nonoperative management and presents for  total hip arthroplasty.   PROCEDURE IN DETAIL: After successful administration of spinal  anesthetic, the traction boots for the [facility] bed were placed on both  feet and the patient was placed onto the [facility] bed, boots placed into the leg  holders. The Right hip was then isolated from the perineum with plastic  drapes and prepped and draped in the usual sterile fashion. ASIS and  greater trochanter were marked and a oblique incision was made, starting  at about 1 cm lateral and 2 cm distal to the ASIS and coursing towards  the anterior cortex of the femur. The skin was cut with a 10 blade  through subcutaneous tissue to the level of the fascia overlying the  tensor fascia lata muscle. The fascia was then incised in line with the  incision at the junction of the anterior third and posterior 2/3rd. The  muscle was teased off the fascia and then the interval between the TFL  and the rectus was developed. The Hohmann retractor was then placed at  the top of the femoral neck over the capsule. The vessels overlying the  capsule were cauterized and the fat on top of the capsule was removed.  A Hohmann retractor was then placed anterior underneath the rectus  femoris to give exposure to the entire anterior capsule. A T-shaped  capsulotomy was performed.  The edges were tagged and the femoral head  was identified.       Osteophytes are removed off the superior acetabulum.  The femoral neck was then cut in situ with an oscillating saw. Traction  was then applied to the left lower extremity utilizing the [facility]  traction. The femoral head was then removed. Retractors were placed  around the acetabulum and then circumferential removal of the labrum was  performed. Osteophytes were also removed. Reaming starts at 45 mm to  medialize and  Increased in 2 mm increments to 49 mm. We reamed in  approximately 40 degrees of abduction, 20 degrees anteversion. A 50 mm  pinnacle acetabular shell was then impacted in anatomic position under  fluoroscopic guidance with excellent purchase. We did not need to place  any additional dome screws. A 32 mm neutral + 4 marathon liner was then  placed into the acetabular shell.       The femoral lift was then placed along the lateral aspect of the femur  just distal to the vastus ridge. The leg was  externally rotated and capsule  was stripped off the inferior aspect of the femoral neck down to the  level of the lesser trochanter, this was done with electrocautery. The femur was lifted after this was performed. The  leg was then placed and extended in adducted position to essentially delivering the femur. We also removed the capsule superiorly and the  piriformis from the  piriformis fossa to gain excellent exposure of the  proximal femur. Rongeur was used to remove some cancellous bone to get  into the lateral portion of the proximal femur for placement of the  initial starter reamer. The starter broaches was placed  the starter broach  and was shown to go down the center of the canal. Broaching  with the  Corail system was then performed starting at size 8, coursing  Up to size 10. A size 10 had excellent torsional and rotational  and axial stability. The trial standard offset neck was then placed  with a 32 + 1  trial head. The hip was then reduced. We confirmed that  the stem was in the canal both on AP and lateral x-rays. It also has excellent sizing. The hip was reduced with outstanding stability through full extension, full external rotation,  and then flexion in adduction internal rotation. AP pelvis was taken  and the leg lengths were measured and found to be exactly equal. Hip  was then dislocated again and the femoral head and neck removed. The  femoral broach was removed. Size 10 Corail stem with a standard offset  neck was then impacted into the femur following native anteversion. Has  excellent purchase in the canal. Excellent torsional and rotational and  axial stability. It is confirmed to be in the canal on AP and lateral  fluoroscopic views. The 32 + 1 ceramic head was placed and the hip  reduced with outstanding stability. Again AP pelvis was taken and it  confirmed that the leg lengths were equal. The wound was then copiously  irrigated with saline solution and the capsule reattached and repaired  with Ethibond suture. 30 ml of .25% Bupivicaine injected into the capsule and into the edge of the tensor fascia lata as well as subcutaneous tissue. The fascia overlying the tensor fascia lata was  then closed with a running #1 V-Loc. Subcu was closed with interrupted  2-0 Vicryl and subcuticular running 4-0 Monocryl. Incision was cleaned  and dried. Steri-Strips and a bulky sterile dressing applied. Hemovac  drain was hooked to suction and then he was awakened and transported to  recovery in stable condition.        Please note that a surgical assistant was a medical necessity for this procedure to perform it in a safe and expeditious manner. Assistant was necessary to provide appropriate retraction of vital neurovascular structures and to prevent femoral fracture and allow for anatomic placement of the prosthesis.  Ollen GrossFrank Michela Herst, M.D.

## 2014-12-07 NOTE — Transfer of Care (Signed)
Immediate Anesthesia Transfer of Care Note  Patient: Stacey ReasDeborah B Kelso  Procedure(s) Performed: Procedure(s): RIGHT TOTAL HIP ARTHROPLASTY ANTERIOR APPROACH (Right)  Patient Location: PACU  Anesthesia Type:General  Level of Consciousness: awake and alert   Airway & Oxygen Therapy: Patient Spontanous Breathing and Patient connected to face mask oxygen  Post-op Assessment: Report given to RN and Post -op Vital signs reviewed and stable  Post vital signs: Reviewed and stable  Last Vitals:  Filed Vitals:   12/07/14 1138  BP: 172/88  Pulse: 89  Temp: 36.4 C  Resp: 18    Complications: No apparent anesthesia complications

## 2014-12-07 NOTE — Anesthesia Postprocedure Evaluation (Signed)
  Anesthesia Post-op Note  Patient: Stacey ReasDeborah B Hernandez  Procedure(s) Performed: Procedure(s) (LRB): RIGHT TOTAL HIP ARTHROPLASTY ANTERIOR APPROACH (Right)  Patient Location: PACU  Anesthesia Type: General  Level of Consciousness: awake and alert   Airway and Oxygen Therapy: Patient Spontanous Breathing  Post-op Pain: mild  Post-op Assessment: Post-op Vital signs reviewed, Patient's Cardiovascular Status Stable, Respiratory Function Stable, Patent Airway and No signs of Nausea or vomiting  Last Vitals:  Filed Vitals:   12/07/14 1606  BP: 172/82  Pulse: 96  Temp:   Resp: 20    Post-op Vital Signs: stable   Complications: No apparent anesthesia complications

## 2014-12-07 NOTE — Anesthesia Procedure Notes (Signed)
Procedure Name: Intubation Date/Time: 12/07/2014 1:50 PM Performed by: Leroy LibmanEARDON, Marvie Calender L Patient Re-evaluated:Patient Re-evaluated prior to inductionOxygen Delivery Method: Circle system utilized Preoxygenation: Pre-oxygenation with 100% oxygen Intubation Type: IV induction Ventilation: Mask ventilation without difficulty and Oral airway inserted - appropriate to patient size Laryngoscope Size: Hyacinth MeekerMiller and 2 Grade View: Grade II Tube type: Oral Tube size: 7.5 mm Number of attempts: 1 Airway Equipment and Method: Stylet Placement Confirmation: ETT inserted through vocal cords under direct vision,  breath sounds checked- equal and bilateral and positive ETCO2 Secured at: 21 cm Tube secured with: Tape Dental Injury: Teeth and Oropharynx as per pre-operative assessment

## 2014-12-08 LAB — BASIC METABOLIC PANEL
Anion gap: 5 (ref 5–15)
BUN: 10 mg/dL (ref 6–20)
CHLORIDE: 107 mmol/L (ref 101–111)
CO2: 27 mmol/L (ref 22–32)
CREATININE: 0.79 mg/dL (ref 0.44–1.00)
Calcium: 8.4 mg/dL — ABNORMAL LOW (ref 8.9–10.3)
GFR calc Af Amer: >60 mL/min (ref >60)
GFR calc non Af Amer: >60 mL/min (ref >60)
GLUCOSE: 162 mg/dL — AB (ref 65–99)
Potassium: 4.2 mmol/L (ref 3.5–5.1)
Sodium: 139 mmol/L (ref 135–145)

## 2014-12-08 LAB — CBC
HEMATOCRIT: 33.2 % — AB (ref 36.0–46.0)
HEMOGLOBIN: 11.2 g/dL — AB (ref 12.0–15.0)
MCH: 32.1 pg (ref 26.0–34.0)
MCHC: 33.7 g/dL (ref 30.0–36.0)
MCV: 95.1 fL (ref 78.0–100.0)
Platelets: 192 10*3/uL (ref 150–400)
RBC: 3.49 MIL/uL — ABNORMAL LOW (ref 3.87–5.11)
RDW: 13.4 % (ref 11.5–15.5)
WBC: 14.4 10*3/uL — ABNORMAL HIGH (ref 4.0–10.5)

## 2014-12-08 MED ORDER — OXYCODONE HCL 5 MG PO TABS: 5.0000 mg | ORAL_TABLET | ORAL | Status: AC | PRN

## 2014-12-08 MED ORDER — TRAMADOL HCL 50 MG PO TABS: 50.0000 mg | ORAL_TABLET | Freq: Four times a day (QID) | ORAL | Status: AC | PRN

## 2014-12-08 MED ORDER — RIVAROXABAN 10 MG PO TABS: 10.0000 mg | ORAL_TABLET | Freq: Every day | ORAL | Status: AC

## 2014-12-08 MED ORDER — METHOCARBAMOL 500 MG PO TABS: 500.0000 mg | ORAL_TABLET | Freq: Four times a day (QID) | ORAL | Status: AC | PRN

## 2014-12-08 NOTE — Progress Notes (Signed)
Utilization review completed.  

## 2014-12-08 NOTE — Discharge Instructions (Addendum)
° °Dr. Frank Aluisio °Total Joint Specialist °[facility] °[address] °[place], [place] [address] °[phone] ° °ANTERIOR APPROACH TOTAL HIP REPLACEMENT POSTOPERATIVE DIRECTIONS ° ° °Hip Rehabilitation, Guidelines Following Surgery  °The results of a hip operation are greatly improved after range of motion and muscle strengthening exercises. Follow all safety measures which are given to protect your hip. If any of these exercises cause increased pain or swelling in your joint, decrease the amount until you are comfortable again. Then slowly increase the exercises. Call your caregiver if you have problems or questions.  ° °HOME CARE INSTRUCTIONS  °Remove items at home which could result in a fall. This includes throw rugs or furniture in walking pathways.  °· ICE to the affected hip every three hours for 30 minutes at a time and then as needed for pain and swelling.  Continue to use ice on the hip for pain and swelling from surgery. You may notice swelling that will progress down to the foot and ankle.  This is normal after surgery.  Elevate the leg when you are not up walking on it.   °· Continue to use the breathing machine which will help keep your temperature down.  It is common for your temperature to cycle up and down following surgery, especially at night when you are not up moving around and exerting yourself.  The breathing machine keeps your lungs expanded and your temperature down. ° ° °DIET °You may resume your previous home diet once your are discharged from the hospital. ° °DRESSING / WOUND CARE / SHOWERING °You may shower 3 days after surgery, but keep the wounds dry during showering.  You may use an occlusive plastic wrap (Press'n Seal for example), NO SOAKING/SUBMERGING IN THE BATHTUB.  If the bandage gets wet, change with a clean dry gauze.  If the incision gets wet, pat the wound dry with a clean towel. °You may start showering once you are discharged home but do not  submerge the incision under water. Just pat the incision dry and apply a dry gauze dressing on daily. °Change the surgical dressing daily and reapply a dry dressing each time. ° °ACTIVITY °Walk with your walker as instructed. °Use walker as long as suggested by your caregivers. °Avoid periods of inactivity such as sitting longer than an hour when not asleep. This helps prevent blood clots.  °You may resume a sexual relationship in one month or when given the OK by your doctor.  °You may return to work once you are cleared by your doctor.  °Do not drive a car for 6 weeks or until released by you surgeon.  °Do not drive while taking narcotics. ° °WEIGHT BEARING °Weight bearing as tolerated with assist device (walker, cane, etc) as directed, use it as long as suggested by your surgeon or therapist, typically at least 4-6 weeks. ° °POSTOPERATIVE CONSTIPATION PROTOCOL °Constipation - defined medically as fewer than three stools per week and severe constipation as less than one stool per week. ° °One of the most common issues patients have following surgery is constipation.  Even if you have a regular bowel pattern at home, your normal regimen is likely to be disrupted due to multiple reasons following surgery.  Combination of anesthesia, postoperative narcotics, change in appetite and fluid intake all can affect your bowels.  In order to avoid complications following surgery, here are some recommendations in order to help you during your recovery period. ° °Colace (docusate) - Pick up an over-the-counter   form of Colace or another stool softener and take twice a day as long as you are requiring postoperative pain medications.  Take with a full glass of water daily.  If you experience loose stools or diarrhea, hold the colace until you stool forms back up.  If your symptoms do not get better within 1 week or if they get worse, check with your doctor. ° °Dulcolax (bisacodyl) - Pick up over-the-counter and take as directed  by the product packaging as needed to assist with the movement of your bowels.  Take with a full glass of water.  Use this product as needed if not relieved by Colace only.  ° °MiraLax (polyethylene glycol) - Pick up over-the-counter to have on hand.  MiraLax is a solution that will increase the amount of water in your bowels to assist with bowel movements.  Take as directed and can mix with a glass of water, juice, soda, coffee, or tea.  Take if you go more than two days without a movement. °Do not use MiraLax more than once per day. Call your doctor if you are still constipated or irregular after using this medication for 7 days in a row. ° °If you continue to have problems with postoperative constipation, please contact the office for further assistance and recommendations.  If you experience "the worst abdominal pain ever" or develop nausea or vomiting, please contact the office immediatly for further recommendations for treatment. ° °ITCHING ° If you experience itching with your medications, try taking only a single pain pill, or even half a pain pill at a time.  You can also use Benadryl over the counter for itching or also to help with sleep.  ° °TED HOSE STOCKINGS °Wear the elastic stockings on both legs for three weeks following surgery during the day but you may remove then at night for sleeping. ° °MEDICATIONS °See your medication summary on the “After Visit Summary” that the nursing staff will review with you prior to discharge.  You may have some home medications which will be placed on hold until you complete the course of blood thinner medication.  It is important for you to complete the blood thinner medication as prescribed by your surgeon.  Continue your approved medications as instructed at time of discharge. ° °PRECAUTIONS °If you experience chest pain or shortness of breath - call 911 immediately for transfer to the hospital emergency department.  °If you develop a fever greater that 101 F,  purulent drainage from wound, increased redness or drainage from wound, foul odor from the wound/dressing, or calf pain - CONTACT YOUR SURGEON.   °                                                °FOLLOW-UP APPOINTMENTS °Make sure you keep all of your appointments after your operation with your surgeon and caregivers. You should call the office at the above phone number and make an appointment for approximately two weeks after the date of your surgery or on the date instructed by your surgeon outlined in the "After Visit Summary". ° °RANGE OF MOTION AND STRENGTHENING EXERCISES  °These exercises are designed to help you keep full movement of your hip joint. Follow your caregiver's or physical therapist's instructions. Perform all exercises about fifteen times, three times per day or as directed. Exercise both hips, even if you   have had only one joint replacement. These exercises can be done on a training (exercise) mat, on the floor, on a table or on a bed. Use whatever works the best and is most comfortable for you. Use music or television while you are exercising so that the exercises are a pleasant break in your day. This will make your life better with the exercises acting as a break in routine you can look forward to.  Lying on your back, slowly slide your foot toward your buttocks, raising your knee up off the floor. Then slowly slide your foot back down until your leg is straight again.  Lying on your back spread your legs as far apart as you can without causing discomfort.  Lying on your side, raise your upper leg and foot straight up from the floor as far as is comfortable. Slowly lower the leg and repeat.  Lying on your back, tighten up the muscle in the front of your thigh (quadriceps muscles). You can do this by keeping your leg straight and trying to raise your heel off the floor. This helps strengthen the largest muscle supporting your knee.  Lying on your back, tighten up the muscles of your  buttocks both with the legs straight and with the knee bent at a comfortable angle while keeping your heel on the floor.   IF YOU ARE TRANSFERRED TO A SKILLED REHAB FACILITY If the patient is transferred to a skilled rehab facility following release from the hospital, a list of the current medications will be sent to the facility for the patient to continue.  When discharged from the skilled rehab facility, please have the facility set up the patient's Home Health Physical Therapy prior to being released. Also, the skilled facility will be responsible for providing the patient with their medications at time of release from the facility to include their pain medication, the muscle relaxants, and their blood thinner medication. If the patient is still at the rehab facility at time of the two week follow up appointment, the skilled rehab facility will also need to assist the patient in arranging follow up appointment in our office and any transportation needs.  MAKE SURE YOU:  Understand these instructions.  Get help right away if you are not doing well or get worse.   Pick up stool softner and laxative for home use following surgery while on pain medications. Do not submerge incision under water. May remove the surgical dressing tomorrow, Friday 12/09/2014, and then apply a dry gauze dressing daily. Please use good hand washing techniques while changing dressing each day. May shower starting three days after surgery on Saturday 12/10/2014. Please use a clean towel to pat the incision dry following showers. Continue to use ice for pain and swelling after surgery. Do not use any lotions or creams on the incision until instructed by your surgeon.  Take Xarelto for two and a half more weeks, then discontinue Xarelto. Once the patient has completed the blood thinner regimen, then take a Baby 81 mg Aspirin daily for three more weeks.    Information on my medicine - XARELTO (Rivaroxaban)  This  medication education was reviewed with me or my healthcare representative as part of my discharge preparation.  The pharmacist that spoke with me during my hospital stay was:  Glogovac,nikola, [facility]  Why was Xarelto prescribed for you? Xarelto was prescribed for you to reduce the risk of blood clots forming after orthopedic surgery. The medical term for these abnormal blood clots  is venous thromboembolism (VTE).  What do you need to know about xarelto ? Take your Xarelto ONCE DAILY at the same time every day. You may take it either with or without food.  If you have difficulty swallowing the tablet whole, you may crush it and mix in applesauce just prior to taking your dose.  Take Xarelto exactly as prescribed by your doctor and DO NOT stop taking Xarelto without talking to the doctor who prescribed the medication.  Stopping without other VTE prevention medication to take the place of Xarelto may increase your risk of developing a clot.  After discharge, you should have regular check-up appointments with your healthcare provider that is prescribing your Xarelto.    What do you do if you miss a dose? If you miss a dose, take it as soon as you remember on the same day then continue your regularly scheduled once daily regimen the next day. Do not take two doses of Xarelto on the same day.   Important Safety Information A possible side effect of Xarelto is bleeding. You should call your healthcare provider right away if you experience any of the following: ? Bleeding from an injury or your nose that does not stop. ? Unusual colored urine (red or dark brown) or unusual colored stools (red or black). ? Unusual bruising for unknown reasons. ? A serious fall or if you hit your head (even if there is no bleeding).  Some medicines may interact with Xarelto and might increase your risk of bleeding while on Xarelto. To help avoid this, consult your healthcare provider or pharmacist prior to  using any new prescription or non-prescription medications, including herbals, vitamins, non-steroidal anti-inflammatory drugs (NSAIDs) and supplements.  This website has more information on Xarelto: [email or web address].com.br.

## 2014-12-08 NOTE — Evaluation (Signed)
Occupational Therapy Evaluation Patient Details Name: Stacey ReasDeborah B Dumais MRN: [id number] DOB: 06/27/1955 Today's Date: 12/08/2014    History of Present Illness Pt is s/p R THA-DA   Clinical Impression   Pt limited by 8/10 pain this session but did transfer to 3in1 and practiced shower transfer and grooming. Pt supposed to d/c today. Feel pt is ok to d/c from OT standpoint today but will see pt if here after today. Husband and daughter present for session.    Follow Up Recommendations  No OT follow up;Supervision/Assistance - 24 hour    Equipment Recommendations  None recommended by OT    Recommendations for Other Services       Precautions / Restrictions Precautions Precautions: None      Mobility Bed Mobility               General bed mobility comments: in chair.   Transfers Overall transfer level: Needs assistance Equipment used: Rolling walker (2 wheeled) Transfers: Sit to/from Stand Sit to Stand: Min guard;Min assist         General transfer comment: cues for hand placement and LE management.    Balance                                            ADL Overall ADL's : Needs assistance/impaired Eating/Feeding: Independent;Sitting   Grooming: Wash/dry hands;Min guard;Standing   Upper Body Bathing: Set up;Sitting   Lower Body Bathing: Minimal assistance;Sit to/from stand   Upper Body Dressing : Set up;Sitting   Lower Body Dressing: Minimal assistance;Sit to/from stand   Toilet Transfer: Min guard;Minimal assistance;Ambulation;BSC;RW   Toileting- Clothing Manipulation and Hygiene: Minimal assistance;Sit to/from stand   Tub/ Shower Transfer: Minimal assistance;Walk-in shower;3 in 1;Rolling walker     General ADL Comments: educated on shower transfer with 3in1 in shower as pt has a built in seat and states walker wont fit through opening of shower. discussed placement of 3in1 facing forward out of the shower and pt stepping back with  use of walker over the ledge and then sitting down on 3in1. Pt needs min cues to not step too close to the walker and take smaller steps. She also needs min support with sitting on 3in1 as she tended to not reach back for armrests . Instructed pt on proper hand placement with functional transfers and she did better as session progressed. Husband and daughter able to assist with LB dressing. didnt have pt reach to R foot this session due to increased pain but she states husband helped alittle with starting underwear this am so clinical judgement of min assist with LB dressing.      Vision     Perception     Praxis      Pertinent Vitals/Pain Pain Assessment: 0-10 Pain Score: 8  Pain Location: R hip Pain Descriptors / Indicators: Aching Pain Intervention(s): Repositioned;Monitored during session;Ice applied     Hand Dominance     Extremity/Trunk Assessment Upper Extremity Assessment Upper Extremity Assessment: Overall WFL for tasks assessed           Communication Communication Communication: No difficulties   Cognition Arousal/Alertness: Awake/alert Behavior During Therapy: WFL for tasks assessed/performed Overall Cognitive Status: Within Functional Limits for tasks assessed                     General Comments  Exercises       Shoulder Instructions      Home Living Family/patient expects to be discharged to:: Private residence Living Arrangements: Spouse/significant other;Children Available Help at Discharge: Family Type of Home: House             Bathroom Shower/Tub: Walk-in Soil scientist Toilet: Standard     Home Equipment: Shower seat - built Scientist, clinical (histocompatibility and immunogenetics);Bedside commode Adaptive Equipment: Reacher        Prior Functioning/Environment Level of Independence: Independent             OT Diagnosis: Generalized weakness   OT Problem List: Decreased strength;Decreased knowledge of use of DME or AE   OT  Treatment/Interventions: Self-care/ADL training;Patient/family education;Therapeutic activities;DME and/or AE instruction    OT Goals(Current goals can be found in the care plan section) Acute Rehab OT Goals Patient Stated Goal: decrease pain. OT Goal Formulation: With patient Time For Goal Achievement: 12/15/14 Potential to Achieve Goals: Good  OT Frequency: Min 2X/week   Barriers to D/C:            Co-evaluation              End of Session Equipment Utilized During Treatment: Gait belt;Rolling walker  Activity Tolerance: Patient limited by pain Patient left: in chair;with call bell/phone within reach;with family/visitor present   Time: 1610-9604 OT Time Calculation (min): 21 min Charges:  OT General Charges $OT Visit: 1 Procedure OT Evaluation $Initial OT Evaluation Tier I: 1 Procedure G-Codes:    Lennox Laity  [phone] 12/08/2014, 12:14 PM

## 2014-12-08 NOTE — Care Management Note (Signed)
Case Management Note  Patient Details  Name: Stacey Hernandez MRN: [id number] Date of Birth: [birth year]  Subjective/Objective:                   RIGHT TOTAL HIP ARTHROPLASTY ANTERIOR APPROACH (Right) Action/Plan: Discharge planning  Expected Discharge Date:  12/08/14               Expected Discharge Plan:  [place]  In-House Referral:     Discharge planning Services  CM Consult  Post Acute Care Choice:  Home Health Choice offered to:  Patient  DME Arranged:  Walker rolling DME Agency:  [place]:  PT [facility] Agency:  [place]  Status of Service:  Completed, signed off  Medicare Important Message Given:    Date Medicare IM Given:    Medicare IM give by:    Date Additional Medicare IM Given:    Additional Medicare Important Message give by:     If discussed at [place] of Stay Meetings, dates discussed:    Additional Comments: CM met withpt in room to offer choice of home health agency.  Pt chooses [facility] to render HHPT.  Referral given to [employer], Tim.  Cm called [facility] rep, Merry Proud to please deliver the rolling walker to room prior to discharge.  No other CM needs were communicated. Dellie Catholic, RN 12/08/2014, 12:07 PM

## 2014-12-08 NOTE — Progress Notes (Signed)
Physical Therapy Treatment Patient Details Name: Stacey Hernandez MRN: [id number] DOB: 07/02/1955 Today's Date: 12/08/2014    History of Present Illness Pt is s/p R THA-DA    PT Comments    Pt motivated and progressing well.  Reviewed stairs and car transfers.  Follow Up Recommendations  Home health PT     Equipment Recommendations  Rolling walker with 5" wheels    Recommendations for Other Services OT consult     Precautions / Restrictions Precautions Precautions: None Restrictions Weight Bearing Restrictions: No Other Position/Activity Restrictions: WBAT    Mobility  Bed Mobility               General bed mobility comments: Pt declines to attempt in/out bed.  States comfortable with spouse ability to assist  Transfers Overall transfer level: Needs assistance Equipment used: Rolling walker (2 wheeled) Transfers: Sit to/from Stand Sit to Stand: Min guard;Supervision         General transfer comment: cues for hand placement and LE management.  Ambulation/Gait Ambulation/Gait assistance: Min guard;Supervision Ambulation Distance (Feet): 150 Feet Assistive device: Rolling walker (2 wheeled) Gait Pattern/deviations: Step-to pattern;Decreased step length - right;Decreased step length - left;Shuffle;Trunk flexed     General Gait Details: cues for sequence, posture and position from RW   Stairs Stairs: Yes Stairs assistance: Min assist Stair Management: No rails;Step to pattern;Backwards;Forwards;With walker Number of Stairs: 2 General stair comments: Single step bkwd and fwd with RW and cues for sequence and foot placement  Wheelchair Mobility    Modified Rankin (Stroke Patients Only)       Balance                                    Cognition Arousal/Alertness: Awake/alert Behavior During Therapy: WFL for tasks assessed/performed Overall Cognitive Status: Within Functional Limits for tasks assessed                       Exercises      General Comments        Pertinent Vitals/Pain Pain Assessment: 0-10 Pain Score: 5  Pain Location: R hip Pain Descriptors / Indicators: Aching;Sore Pain Intervention(s): Limited activity within patient's tolerance;Monitored during session;Premedicated before session    Home Living                      Prior Function            PT Goals (current goals can now be found in the care plan section) Acute Rehab PT Goals Patient Stated Goal: decrease pain. PT Goal Formulation: With patient Time For Goal Achievement: 12/15/14 Potential to Achieve Goals: Good Progress towards PT goals: Progressing toward goals    Frequency  7X/week    PT Plan Current plan remains appropriate    Co-evaluation             End of Session Equipment Utilized During Treatment: Gait belt Activity Tolerance: Patient tolerated treatment well Patient left: Other (comment) (EOB)     Time: [id number] PT Time Calculation (min) (ACUTE ONLY): 16 min  Charges:  $Gait Training: 8-22 mins                    G Codes:      Stacey Hernandez 12/08/2014, 4:44 PM

## 2014-12-08 NOTE — Progress Notes (Signed)
   Subjective: 1 Day Post-Op Procedure(s) (LRB): RIGHT TOTAL HIP ARTHROPLASTY ANTERIOR APPROACH (Right) Patient reports pain as mild.   Patient seen in rounds by Dr. Lequita HaltAluisio. Patient is well, and has had no acute complaints or problems Patient is ready to go home following therapy  Objective: Vital signs in last 24 hours: Temp:  [97.5 F (36.4 C)-98.5 F (36.9 C)] 98.3 F (36.8 C) (10/13 0555) Pulse Rate:  [70-104] 70 (10/13 0555) Resp:  [12-20] 16 (10/13 0555) BP: (118-178)/(61-88) 124/61 mmHg (10/13 0555) SpO2:  [93 %-100 %] 98 % (10/13 0555) Weight:  [86.637 kg (191 lb)-86.75 kg (191 lb 4 oz)] 86.637 kg (191 lb) (10/12 1628)  Intake/Output from previous day:  Intake/Output Summary (Last 24 hours) at 12/08/14 0848 Last data filed at 12/08/14 0600  Gross per 24 hour  Intake   4620 ml  Output   3830 ml  Net    790 ml    Labs:  Recent Labs  12/08/14 0443  HGB 11.2*    Recent Labs  12/08/14 0443  WBC 14.4*  RBC 3.49*  HCT 33.2*  PLT 192    Recent Labs  12/08/14 0443  NA 139  K 4.2  CL 107  CO2 27  BUN 10  CREATININE 0.79  GLUCOSE 162*  CALCIUM 8.4*   No results for input(s): LABPT, INR in the last 72 hours.  EXAM: General - Patient is Alert, Appropriate and Oriented Extremity - Neurovascular intact Sensation intact distally Dorsiflexion/Plantar flexion intact Incision - clean, dry, no drainage, healing Motor Function - intact, moving foot and toes well on exam.   Assessment/Plan: 1 Day Post-Op Procedure(s) (LRB): RIGHT TOTAL HIP ARTHROPLASTY ANTERIOR APPROACH (Right) Procedure(s) (LRB): RIGHT TOTAL HIP ARTHROPLASTY ANTERIOR APPROACH (Right) Past Medical History  Diagnosis Date  . Hypothyroidism   . Hip pain     RT  . Hypertension     pt is currently on no medications for hypertension   . Arthritis   . Cataracts, bilateral    Principal Problem:   OA (osteoarthritis) of hip  Estimated body mass index is 30.84 kg/(m^2) as calculated  from the following:   Height as of this encounter: 5\' 6"  (1.676 m).   Weight as of this encounter: 86.637 kg (191 lb). Up with therapy D/C IV fluids Discharge home with home health Diet - Cardiac diet Follow up - in 2 weeks Activity - WBAT Disposition - Home Condition Upon Discharge - Good D/C Meds - See DC Summary DVT Prophylaxis - Xarelto  Avel Peacerew Perkins, PA-C Orthopaedic Surgery 12/08/2014, 8:48 AM

## 2014-12-08 NOTE — Evaluation (Signed)
Physical Therapy Evaluation Patient Details Name: Stacey ReasDeborah B Mccalla MRN: [id number] DOB: 11/28/1955 Today's Date: 12/08/2014   History of Present Illness  Pt is s/p R THA-DA  Clinical Impression  Pt s/p R THR presents with decreased R LE strength/ROM and post op pain limiting functional mobility.  Pt should progress to dc home with family assist and HHPT follow up    Follow Up Recommendations Home health PT    Equipment Recommendations  Rolling walker with 5" wheels    Recommendations for Other Services OT consult     Precautions / Restrictions Precautions Precautions: None Restrictions Weight Bearing Restrictions: No Other Position/Activity Restrictions: WBAT      Mobility  Bed Mobility Overal bed mobility: Needs Assistance Bed Mobility: Supine to Sit     Supine to sit: Min assist     General bed mobility comments: in chair.   Transfers Overall transfer level: Needs assistance Equipment used: Rolling walker (2 wheeled) Transfers: Sit to/from Stand Sit to Stand: Min guard;Min assist         General transfer comment: cues for hand placement and LE management.  Ambulation/Gait Ambulation/Gait assistance: Min assist Ambulation Distance (Feet): 130 Feet Assistive device: Rolling walker (2 wheeled) Gait Pattern/deviations: Step-to pattern;Decreased step length - right;Decreased step length - left;Shuffle;Trunk flexed     General Gait Details: cues for sequence, posture and position from [employer]  Stairs            Wheelchair Mobility    Modified Rankin (Stroke Patients Only)       Balance                                             Pertinent Vitals/Pain Pain Assessment: 0-10 Pain Score: 8  Pain Location: R hip Pain Descriptors / Indicators: Aching Pain Intervention(s): Repositioned;Monitored during session;Ice applied    Home Living Family/patient expects to be discharged to:: Private residence Living Arrangements:  Spouse/significant other;Children Available Help at Discharge: Family Type of Home: House Home Access: Stairs to enter   Secretary/administratorntrance Stairs-Number of Steps: 1+1 Home Layout: One level Home Equipment: Shower seat - built Scientist, clinical (histocompatibility and immunogenetics)in;Adaptive equipment;Bedside commode      Prior Function Level of Independence: Independent               Hand Dominance        Extremity/Trunk Assessment   Upper Extremity Assessment: Overall WFL for tasks assessed           Lower Extremity Assessment: RLE deficits/detail RLE Deficits / Details: Strength at hip 3-/5 with AAROM at hip to 80 flex and 15 abd    Cervical / Trunk Assessment: Normal  Communication   Communication: No difficulties  Cognition Arousal/Alertness: Awake/alert Behavior During Therapy: WFL for tasks assessed/performed Overall Cognitive Status: Within Functional Limits for tasks assessed                      General Comments      Exercises Total Joint Exercises Ankle Circles/Pumps: AROM;Both;15 reps;Supine Quad Sets: AROM;Both;10 reps;Supine Heel Slides: AAROM;Right;15 reps;Supine Hip ABduction/ADduction: AAROM;Right;15 reps;Supine      Assessment/Plan    PT Assessment Patient needs continued PT services  PT Diagnosis Difficulty walking   PT Problem List Decreased strength;Decreased range of motion;Decreased activity tolerance;Decreased mobility;Pain;Decreased knowledge of use of DME  PT Treatment Interventions DME instruction;Gait training;Stair training;Functional mobility training;Therapeutic activities;Therapeutic exercise;Patient/family education  PT Goals (Current goals can be found in the Care Plan section) Acute Rehab PT Goals Patient Stated Goal: decrease pain. PT Goal Formulation: With patient Time For Goal Achievement: 12/15/14 Potential to Achieve Goals: Good    Frequency 7X/week   Barriers to discharge        Co-evaluation               End of Session Equipment Utilized During  Treatment: Gait belt Activity Tolerance: Patient tolerated treatment well Patient left: in chair;with call bell/phone within reach;with family/visitor present Nurse Communication: Mobility status         Time: 7425-9563 PT Time Calculation (min) (ACUTE ONLY): 26 min   Charges:   PT Evaluation $Initial PT Evaluation Tier I: 1 Procedure PT Treatments $Therapeutic Exercise: 8-22 mins   PT G Codes:        Brezlyn Manrique December 16, 2014, 12:41 PM

## 2014-12-08 NOTE — Discharge Summary (Signed)
Physician Discharge Summary   Patient ID: Stacey Hernandez MRN: [id number] DOB/AGE: [birth year] 59 y.o.  Admit date: 12/07/2014 Discharge date: 12-08-2014  Primary Diagnosis:  Osteoarthritis of the Right hip.   Admission Diagnoses:  Past Medical History  Diagnosis Date  . Hypothyroidism   . Hip pain     RT  . Hypertension     pt is currently on no medications for hypertension   . Arthritis   . Cataracts, bilateral    Discharge Diagnoses:   Principal Problem:   OA (osteoarthritis) of hip  Estimated body mass index is 30.84 kg/(m^2) as calculated from the following:   Height as of this encounter: $RemoveBeforeD'5\' 6"'[id number]$  (1.676 m).   Weight as of this encounter: 86.637 kg (191 lb).  Procedure(s) (LRB): RIGHT TOTAL HIP ARTHROPLASTY ANTERIOR APPROACH (Right)   Consults: None  HPI: Stacey Hernandez is a 59 y.o. female who has advanced end-  stage arthritis of her Right hip with progressively worsening pain and  dysfunction.The patient has failed nonoperative management and presents for  total hip arthroplasty.   Laboratory Data: Admission on 12/07/2014  Component Date Value Ref Range Status  . WBC 12/08/2014 14.4* 4.0 - 10.5 K/uL Final  . RBC 12/08/2014 3.49* 3.87 - 5.11 MIL/uL Final  . Hemoglobin 12/08/2014 11.2* 12.0 - 15.0 g/dL Final  . HCT 12/08/2014 33.2* 36.0 - 46.0 % Final  . MCV 12/08/2014 95.1  78.0 - 100.0 fL Final  . MCH 12/08/2014 32.1  26.0 - 34.0 pg Final  . MCHC 12/08/2014 33.7  30.0 - 36.0 g/dL Final  . RDW 12/08/2014 13.4  11.5 - 15.5 % Final  . Platelets 12/08/2014 192  150 - 400 K/uL Final  . Sodium 12/08/2014 139  135 - 145 mmol/L Final  . Potassium 12/08/2014 4.2  3.5 - 5.1 mmol/L Final  . Chloride 12/08/2014 107  101 - 111 mmol/L Final  . CO2 12/08/2014 27  22 - 32 mmol/L Final  . Glucose, Bld 12/08/2014 162* 65 - 99 mg/dL Final  . BUN 12/08/2014 10  6 - 20 mg/dL Final  . Creatinine, Ser 12/08/2014 0.79  0.44 - 1.00 mg/dL Final  . Calcium 12/08/2014 8.4* 8.9 -  10.3 mg/dL Final  . GFR calc non Af Amer 12/08/2014 >60  >60 mL/min Final  . GFR calc Af Amer 12/08/2014 >60  >60 mL/min Final   Comment: (NOTE) The eGFR has been calculated using the CKD EPI equation. This calculation has not been validated in all clinical situations. eGFR's persistently <60 mL/min signify possible Chronic Kidney Disease.   [name] gap 12/08/2014 5  5 - 15 Final  Hospital Outpatient Visit on 11/25/2014  Component Date Value Ref Range Status  . aPTT 11/25/2014 34  24 - 37 seconds Final  . WBC 11/25/2014 10.2  4.0 - 10.5 K/uL Final  . RBC 11/25/2014 4.46  3.87 - 5.11 MIL/uL Final  . Hemoglobin 11/25/2014 14.1  12.0 - 15.0 g/dL Final  . HCT 11/25/2014 42.5  36.0 - 46.0 % Final  . MCV 11/25/2014 95.3  78.0 - 100.0 fL Final  . MCH 11/25/2014 31.6  26.0 - 34.0 pg Final  . MCHC 11/25/2014 33.2  30.0 - 36.0 g/dL Final  . RDW 11/25/2014 13.2  11.5 - 15.5 % Final  . Platelets 11/25/2014 242  150 - 400 K/uL Final  . Sodium 11/25/2014 138  135 - 145 mmol/L Final  . Potassium 11/25/2014 4.1  3.5 - 5.1 mmol/L Final  . Chloride  11/25/2014 104  101 - 111 mmol/L Final  . CO2 11/25/2014 29  22 - 32 mmol/L Final  . Glucose, Bld 11/25/2014 84  65 - 99 mg/dL Final  . BUN 11/25/2014 12  6 - 20 mg/dL Final  . Creatinine, Ser 11/25/2014 0.78  0.44 - 1.00 mg/dL Final  . Calcium 11/25/2014 9.3  8.9 - 10.3 mg/dL Final  . Total Protein 11/25/2014 7.4  6.5 - 8.1 g/dL Final  . Albumin 11/25/2014 4.2  3.5 - 5.0 g/dL Final  . AST 11/25/2014 27  15 - 41 U/L Final  . ALT 11/25/2014 20  14 - 54 U/L Final  . Alkaline Phosphatase 11/25/2014 51  38 - 126 U/L Final  . Total Bilirubin 11/25/2014 0.8  0.3 - 1.2 mg/dL Final  . GFR calc non Af Amer 11/25/2014 >60  >60 mL/min Final  . GFR calc Af Amer 11/25/2014 >60  >60 mL/min Final   Comment: (NOTE) The eGFR has been calculated using the CKD EPI equation. This calculation has not been validated in all clinical situations. eGFR's persistently <60  mL/min signify possible Chronic Kidney Disease.   . Anion gap 11/25/2014 5  5 - 15 Final  . Prothrombin Time 11/25/2014 13.0  11.6 - 15.2 seconds Final  . INR 11/25/2014 0.96  0.00 - 1.49 Final  . ABO/RH(D) 11/25/2014 A POS   Final  . Antibody Screen 11/25/2014 NEG   Final  . Sample Expiration 11/25/2014 12/09/2014   Final  . Color, Urine 11/25/2014 YELLOW  YELLOW Final  . APPearance 11/25/2014 CLEAR  CLEAR Final  . Specific Gravity, Urine 11/25/2014 1.006  1.005 - 1.030 Final  . pH 11/25/2014 7.0  5.0 - 8.0 Final  . Glucose, UA 11/25/2014 NEGATIVE  NEGATIVE mg/dL Final  . Hgb urine dipstick 11/25/2014 NEGATIVE  NEGATIVE Final  . Bilirubin Urine 11/25/2014 NEGATIVE  NEGATIVE Final  . Ketones, ur 11/25/2014 NEGATIVE  NEGATIVE mg/dL Final  . Protein, ur 11/25/2014 NEGATIVE  NEGATIVE mg/dL Final  . Urobilinogen, UA 11/25/2014 0.2  0.0 - 1.0 mg/dL Final  . Nitrite 11/25/2014 NEGATIVE  NEGATIVE Final  . Leukocytes, UA 11/25/2014 NEGATIVE  NEGATIVE Final   MICROSCOPIC NOT DONE ON URINES WITH NEGATIVE PROTEIN, BLOOD, LEUKOCYTES, NITRITE, OR GLUCOSE <1000 mg/dL.  Marland Kitchen MRSA, PCR 11/25/2014 NEGATIVE  NEGATIVE Final  . Staphylococcus aureus 11/25/2014 POSITIVE* NEGATIVE Final   Comment:        The Xpert SA Assay (FDA approved for NASAL specimens in patients over 17 years of age), is one component of a comprehensive surveillance program.  Test performance has been validated by [facility] for patients greater than or equal to 50 year old. It is not intended to diagnose infection nor to guide or monitor treatment.   . ABO/RH(D) 11/25/2014 A POS   Final     X-Rays:Dg Pelvis Portable  12/07/2014  CLINICAL DATA:  Postop right hip replacement EXAM: PORTABLE PELVIS 1-2 VIEWS COMPARISON:  None. FINDINGS: Changes of right hip replacement. Normal AP alignment. No hardware or bony complicating feature. Soft tissue drain in place. IMPRESSION: Right hip replacement without complicating feature.  Electronically Signed   By: Rolm Baptise M.D.   On: 12/07/2014 15:45   Dg C-arm 1-60 Min-no Report  12/07/2014  CLINICAL DATA: surgery C-ARM 1-60 MINUTES Fluoroscopy was utilized by the requesting physician.  No radiographic interpretation.    EKG: Orders placed or performed during the hospital encounter of 11/25/14  . EKG 12-Lead  . EKG 12-Lead  Hospital Course: Patient was admitted to [facility] and taken to the OR and underwent the above state procedure without complications.  Patient tolerated the procedure well and was later transferred to the recovery room and then to the orthopaedic floor for postoperative care.  They were given PO and IV analgesics for pain control following their surgery.  They were given 24 hours of postoperative antibiotics of  Anti-infectives    Start     Dose/Rate Route Frequency Ordered Stop   12/07/14 2000  ceFAZolin (ANCEF) IVPB 2 g/50 mL premix     2 g 100 mL/hr over 30 Minutes Intravenous Every 6 hours 12/07/14 1639 12/08/14 0138   12/07/14 1138  ceFAZolin (ANCEF) IVPB 2 g/50 mL premix     2 g 100 mL/hr over 30 Minutes Intravenous On call to O.R. 12/07/14 1138 12/07/14 1353     and started on DVT prophylaxis in the form of Xarelto.   PT and OT were ordered for total hip protocol.  The patient was allowed to be WBAT with therapy. Discharge planning was consulted to help with postop disposition and equipment needs.  Patient had a good night on the evening of surgery.  They started to get up OOB with therapy on day one.  Hemovac drain was pulled without difficulty.  Patient was seen in rounds on day one by Dr. Wynelle Link and it was felt that as long as they did well with two sessions of therapy that they would be ready to go home later that afternoon.  Discharge home with home health Diet - Cardiac diet Follow up - in 2 weeks Activity - WBAT Disposition - Home Condition Upon Discharge - Good D/C Meds - See DC Summary DVT Prophylaxis -  Xarelto  Discharge Instructions    Call MD / Call 911    Complete by:  As directed   If you experience chest pain or shortness of breath, CALL 911 and be transported to the hospital emergency room.  If you develope a fever above 101 F, pus (white drainage) or increased drainage or redness at the wound, or calf pain, call your surgeon's office.     Change dressing    Complete by:  As directed   You may change your dressing dressing daily with sterile 4 x 4 inch gauze dressing and paper tape.  Do not submerge the incision under water.     Constipation Prevention    Complete by:  As directed   Drink plenty of fluids.  Prune juice may be helpful.  You may use a stool softener, such as Colace (over the counter) 100 mg twice a day.  Use MiraLax (over the counter) for constipation as needed.     Diet - low sodium heart healthy    Complete by:  As directed      Discharge instructions    Complete by:  As directed   Pick up stool softner and laxative for home use following surgery while on pain medications. Do not submerge incision under water. May remove the surgical dressing tomorrow, Friday 12/08/2014, and then apply a dry gauze dressing daily. Please use good hand washing techniques while changing dressing each day. May shower starting three days after surgery on Saturday 12/10/2014. Please use a clean towel to pat the incision dry following showers. Continue to use ice for pain and swelling after surgery. Do not use any lotions or creams on the incision until instructed by your surgeon.  Total Hip Protocol.  Take  Xarelto for two and a half more weeks, then discontinue Xarelto. Once the patient has completed the blood thinner regimen, then take a Baby 81 mg Aspirin daily for three more weeks.  Postoperative Constipation Protocol  Constipation - defined medically as fewer than three stools per week and severe constipation as less than one stool per week.  One of the most common issues  patients have following surgery is constipation.  Even if you have a regular bowel pattern at home, your normal regimen is likely to be disrupted due to multiple reasons following surgery.  Combination of anesthesia, postoperative narcotics, change in appetite and fluid intake all can affect your bowels.  In order to avoid complications following surgery, here are some recommendations in order to help you during your recovery period.  Colace (docusate) - Pick up an over-the-counter form of Colace or another stool softener and take twice a day as long as you are requiring postoperative pain medications.  Take with a full glass of water daily.  If you experience loose stools or diarrhea, hold the colace until you stool forms back up.  If your symptoms do not get better within 1 week or if they get worse, check with your doctor.  Dulcolax (bisacodyl) - Pick up over-the-counter and take as directed by the product packaging as needed to assist with the movement of your bowels.  Take with a full glass of water.  Use this product as needed if not relieved by Colace only.   MiraLax (polyethylene glycol) - Pick up over-the-counter to have on hand.  MiraLax is a solution that will increase the amount of water in your bowels to assist with bowel movements.  Take as directed and can mix with a glass of water, juice, soda, coffee, or tea.  Take if you go more than two days without a movement. Do not use MiraLax more than once per day. Call your doctor if you are still constipated or irregular after using this medication for 7 days in a row.  If you continue to have problems with postoperative constipation, please contact the office for further assistance and recommendations.  If you experience "the worst abdominal pain ever" or develop nausea or vomiting, please contact the office immediatly for further recommendations for treatment.     Do not sit on low chairs, stoools or toilet seats, as it may be difficult to get  up from low surfaces    Complete by:  As directed      Driving restrictions    Complete by:  As directed   No driving until released by the physician.     Increase activity slowly as tolerated    Complete by:  As directed      Lifting restrictions    Complete by:  As directed   No lifting until released by the physician.     Patient may shower    Complete by:  As directed   You may shower without a dressing once there is no drainage.  Do not wash over the wound.  If drainage remains, do not shower until drainage stops.     TED hose    Complete by:  As directed   Use stockings (TED hose) for 3 weeks on both leg(s).  You may remove them at night for sleeping.     Weight bearing as tolerated    Complete by:  As directed   Laterality:  right  Extremity:  Lower  Medication List    STOP taking these medications        acidophilus Caps capsule     B-complex with vitamin C tablet     CALCIUM + D PO     HYDROcodone-acetaminophen 5-325 MG tablet  Commonly known as:  NORCO/VICODIN     multivitamin with minerals Tabs tablet     PROGESTERONE PO     vitamin B-12 1000 MCG tablet  Commonly known as:  CYANOCOBALAMIN      TAKE these medications        levothyroxine 112 MCG tablet  Commonly known as:  SYNTHROID, LEVOTHROID  Take 112 mcg by mouth daily before breakfast.     methocarbamol 500 MG tablet  Commonly known as:  ROBAXIN  Take 1 tablet (500 mg total) by mouth every 6 (six) hours as needed for muscle spasms.     montelukast 10 MG tablet  Commonly known as:  SINGULAIR  TAKE 1 TABLET (10 MG TOTAL) BY MOUTH DAILY.     oxyCODONE 5 MG immediate release tablet  Commonly known as:  Oxy IR/ROXICODONE  Take 1-2 tablets (5-10 mg total) by mouth every 3 (three) hours as needed for moderate pain or severe pain.     rivaroxaban 10 MG Tabs tablet  Commonly known as:  XARELTO  Take 1 tablet (10 mg total) by mouth daily with breakfast. Take Xarelto for two and a half  more weeks, then discontinue Xarelto. Once the patient has completed the blood thinner regimen, then take a Baby 81 mg Aspirin daily for three more weeks.     traMADol 50 MG tablet  Commonly known as:  ULTRAM  Take 1-2 tablets (50-100 mg total) by mouth every 6 (six) hours as needed (mild pain).           Follow-up Information    Follow up with Gearlean Alf, MD. Schedule an appointment as soon as possible for a visit on 12/20/2014.   Specialty:  Orthopedic Surgery   Why:  Call office at [phone] to setup appointment on Tuesday 12/20/2014 with Dr. Wynelle Link.   Contact information:   [address] [place] [address] [phone]       Signed: Arlee Muslim, PA-C Orthopaedic Surgery 12/08/2014, 9:00 AM

## 2016-06-04 ENCOUNTER — Ambulatory Visit: Payer: Self-pay | Admitting: Surgery
